# Patient Record
Sex: Female | Born: 1975
Health system: Southern US, Community
[De-identification: ages and names within clinical notes are randomized; demographics above are authoritative.]

---

## 2016-08-03 ENCOUNTER — Other Ambulatory Visit: Payer: Self-pay | Admitting: Obstetrics and Gynecology

## 2016-08-03 DIAGNOSIS — Z369 Encounter for antenatal screening, unspecified: Secondary | ICD-10-CM

## 2016-08-03 LAB — OB RESULTS CONSOLE GC/CHLAMYDIA
CHLAMYDIA, DNA PROBE: NEGATIVE
GC PROBE AMP, GENITAL: NEGATIVE

## 2016-08-03 LAB — OB RESULTS CONSOLE ANTIBODY SCREEN: Antibody Screen: NEGATIVE

## 2016-08-03 LAB — OB RESULTS CONSOLE ABO/RH: RH TYPE: POSITIVE

## 2016-08-03 LAB — OB RESULTS CONSOLE VARICELLA ZOSTER ANTIBODY, IGG: Varicella: IMMUNE

## 2016-08-03 LAB — OB RESULTS CONSOLE RUBELLA ANTIBODY, IGM: Rubella: IMMUNE

## 2016-08-03 LAB — OB RESULTS CONSOLE HEPATITIS B SURFACE ANTIGEN: HEP B S AG: NEGATIVE

## 2016-08-03 LAB — OB RESULTS CONSOLE HIV ANTIBODY (ROUTINE TESTING): HIV: NONREACTIVE

## 2016-08-03 LAB — OB RESULTS CONSOLE RPR: RPR: NONREACTIVE

## 2016-08-22 ENCOUNTER — Ambulatory Visit (HOSPITAL_BASED_OUTPATIENT_CLINIC_OR_DEPARTMENT_OTHER)
Admission: RE | Admit: 2016-08-22 | Discharge: 2016-08-22 | Disposition: A | Discharge status: Home / Self Care | Payer: BLUE CROSS/BLUE SHIELD | Source: Ambulatory Visit | Attending: Maternal and Fetal Medicine | Admitting: Maternal and Fetal Medicine

## 2016-08-22 ENCOUNTER — Encounter: Payer: Self-pay | Admitting: *Deleted

## 2016-08-22 ENCOUNTER — Ambulatory Visit
Admission: RE | Admit: 2016-08-22 | Discharge: 2016-08-22 | Disposition: A | Discharge status: Home / Self Care | Payer: BLUE CROSS/BLUE SHIELD | Source: Ambulatory Visit | Attending: Maternal and Fetal Medicine | Admitting: Maternal and Fetal Medicine

## 2016-08-22 DIAGNOSIS — O09512 Supervision of elderly primigravida, second trimester: Secondary | ICD-10-CM

## 2016-08-22 DIAGNOSIS — Z8279 Family history of other congenital malformations, deformations and chromosomal abnormalities: Secondary | ICD-10-CM

## 2016-08-22 DIAGNOSIS — Z3A14 14 weeks gestation of pregnancy: Secondary | ICD-10-CM | POA: Insufficient documentation

## 2016-08-22 DIAGNOSIS — Z3490 Encounter for supervision of normal pregnancy, unspecified, unspecified trimester: Secondary | ICD-10-CM

## 2016-08-22 DIAGNOSIS — O09519 Supervision of elderly primigravida, unspecified trimester: Secondary | ICD-10-CM | POA: Insufficient documentation

## 2016-08-22 DIAGNOSIS — Z369 Encounter for antenatal screening, unspecified: Secondary | ICD-10-CM

## 2016-08-22 NOTE — Progress Notes (Signed)
Referring Provider:   [facility] Length of Consultation: 45 minutes  [name] was referred to [facility] for genetic counseling because of advanced maternal age.  The patient will be 40 years old at the time of delivery.  This note summarizes the information we discussed.    We explained that the chance of a chromosome abnormality increases with maternal age.  Chromosomes and examples of chromosome problems were reviewed.  Humans typically have 46 chromosomes in each cell, with half passed through each sperm and egg.  Any change in the number or structure of chromosomes can increase the risk of problems in the physical and mental development of a pregnancy.   Based upon age of the patient, the chance of any chromosome abnormality was 1 in 72. The chance of Down syndrome, the most common chromosome problem associated with maternal age, was 1 in 21.  The risk of chromosome problems is in addition to the 3% general population risk for birth defects and mental retardation.  The greatest chance, of course, is that the baby would be born in good health.  We discussed the following prenatal screening and testing options for this pregnancy:  First trimester screening, which includes nuchal translucency ultrasound screen and first trimester maternal serum marker screening.  The nuchal translucency has approximately an 80% detection rate for Down syndrome and can be positive for other chromosome abnormalities as well as heart defects.  When combined with a maternal serum marker screening, the detection rate is up to 90% for Down syndrome and up to 97% for trisomy 18.     The chorionic villus sampling procedure is available for first trimester chromosome analysis.  This involves the withdrawal of a small amount of chorionic villi (tissue from the developing placenta).  Risk of pregnancy loss is estimated to be approximately 1 in 200 to 1 in 100 (0.5 to 1%).  There is  approximately a 1% (1 in 100) chance that the CVS chromosome results will be unclear.  Chorionic villi cannot be tested for neural tube defects.     Maternal serum marker screening, a blood test that measures pregnancy proteins, can provide risk assessments for Down syndrome, trisomy 18, and open neural tube defects (spina bifida, anencephaly). Because it does not directly examine the fetus, it cannot positively diagnose or rule out these problems.  Targeted ultrasound uses high frequency sound waves to create an image of the developing fetus.  An ultrasound is often recommended as a routine means of evaluating the pregnancy.  It is also used to screen for fetal anatomy problems (for example, a heart defect) that might be suggestive of a chromosomal or other abnormality.   Amniocentesis involves the removal of a small amount of amniotic fluid from the sac surrounding the fetus with the use of a thin needle inserted through the maternal abdomen and uterus.  Ultrasound guidance is used throughout the procedure.  Fetal cells from amniotic fluid are directly evaluated and > 99.5% of chromosome problems and > 98% of open neural tube defects can be detected. This procedure is generally performed after the 15th week of pregnancy.  The main risks to this procedure include complications leading to miscarriage in less than 1 in 200 cases (0.5%).  We also reviewed the availability of cell free fetal DNA testing from maternal blood to determine whether or not the baby may have either Down syndrome, trisomy 35, or trisomy 43.  This test utilizes a maternal blood sample and DNA  sequencing technology to isolate circulating cell free fetal DNA from maternal plasma.  The fetal DNA can then be analyzed for DNA sequences that are derived from the three most common chromosomes involved in aneuploidy, chromosomes 13, 18, and 21.  If the overall amount of DNA is greater than the expected level for any of these chromosomes,  aneuploidy is suspected.  While we do not consider it a replacement for invasive testing and karyotype analysis, a negative result from this testing would be reassuring, though not a guarantee of a normal chromosome complement for the baby.  An abnormal result is certainly suggestive of an abnormal chromosome complement, though we would still recommend CVS or amniocentesis to confirm any findings from this testing.  Cystic Fibrosis and Spinal Muscular Atrophy (SMA) screening were also discussed with the patient. Both conditions are recessive, which means that both parents must be carriers in order to have a child with the disease.  Cystic fibrosis (CF) is one of the most common genetic conditions in persons of Caucasian ancestry.  This condition occurs in approximately 1 in 2,500 Caucasian persons and results in thickened secretions in the lungs, digestive, and reproductive systems.  For a baby to be at risk for having CF, both of the parents must be carriers for this condition.  Approximately 1 in 7 Caucasian persons is a carrier for CF.  Current carrier testing looks for the most common mutations in the gene for CF and can detect approximately 90% of carriers in the Caucasian population.  This means that the carrier screening can greatly reduce, but cannot eliminate, the chance for an individual to have a child with CF.  If an individual is found to be a carrier for CF, then carrier testing would be available for the partner. As part of [place] newborn screening profile, all babies born in the state of [place] will have a two-tier screening process.  Specimens are first tested to determine the concentration of immunoreactive trypsinogen (IRT).  The top 5% of specimens with the highest IRT values then undergo DNA testing using a panel of over 40 common CF mutations. SMA is a neurodegenerative disorder that leads to atrophy of skeletal muscle and overall weakness.  This condition is also more  prevalent in the Caucasian population, with 1 in 40-1 in 60 persons being a carrier and 1 in 6,000-1 in 10,000 children being affected.  There are multiple forms of the disease, with some causing death in infancy to other forms with survival into adulthood.  The genetics of SMA is complex, but carrier screening can detect up to 95% of carriers in the Caucasian population.  Similar to CF, a negative result can greatly reduce, but cannot eliminate, the chance to have a child with SMA.  We obtained a detailed family history and pregnancy history.  The patient shared that she has a paternal first cousin once removed with Down syndrome. We discussed that Down syndrome, or Trisomy 66, can occur in two main ways. The first is spontaneous nondisjunction, leading to three free chromosome 21's in the baby. If this is the case in a family member, the risk for this couple to have a baby with Down syndrome is not elevated above her age-related risk (1/96 or about 1%). Spontaneous nondisjunction causes about 95% of Down syndrome cases. A less common way for Down syndrome to occur (less than 5% of cases) is through an unbalanced translocation arising from a parent with a balanced translocation between two chromosomes, one  of which is chromosome 21. If the chromosome with attached chromosome 21 material is passed on, the baby can have three chromosome 21's, two free 21's and one attached to another chromosome. We discussed that signs of a family member having a balanced translocation can include a history of multiple miscarriages or birth defects, which were not indicated in [name]'s family history.   The patient also reported three paternal aunts with breast cancer in their 73's and 100's. She mentioned that she has had no genetic testing, but it has been offered to her. We discussed that if possible, individuals in the family with a diagnosis of breast cancer should be tested first. If she becomes more interested in  genetic testing for breast cancer, we can put her in contact with a cancer genetic counselor.  [name] reported no other family history of individuals being born deaf or blind, having muscle weakness, a blood disorder, intellectual disability, birth defects, or more than three miscarriages. Consanguinity and Ashkenazi Jewish ancestry were denied.   [name] stated this is her first pregnancy.  She reported no complications or exposures that would be expected to increase the risk for birth defects.  After consideration of the options, [name] elected to proceed with Informaseq with XY as well as CF and SMA carrier testing.  An ultrasound was performed at the time of the visit.  The gestational age was consistent with  24 weeks.  Fetal anatomy could not be assessed due to early gestational age.  Please refer to the ultrasound report for details of that study.  [name] was encouraged to call with questions or concerns.  We can be contacted at [phone].   Wilburt Finlay, MS, CGC

## 2016-08-23 LAB — MISC LABCORP TEST (SEND OUT): LABCORP TEST CODE: 450010

## 2016-08-26 LAB — INFORMASEQ(SM) WITH XY ANALYSIS
FETAL NUMBER: 1
Fetal Fraction (%):: 15.3
Gestational Age at Collection: 15 weeks
Weight: 161 [lb_av]

## 2016-08-26 LAB — CYSTIC FIBROSIS GENE TEST

## 2016-08-29 ENCOUNTER — Telehealth: Payer: Self-pay | Admitting: Obstetrics and Gynecology

## 2016-08-29 NOTE — Telephone Encounter (Signed)
I spoke with Ms. Macario regarding the results of her InformaSeq testing as well as CF carrier screening drawn on 08/22/2016.  The SMA screening from that date is still pending.  The patient was informed of the results of her recent InformaSeq testing (performed at [facility]) which yielded NEGATIVE results.  The patient's specimen showed DNA consistent with two copies of chromosomes 21, 18 and 13.  The sensitivity for trisomy 24, trisomy 22 and trisomy 88 using this testing are reported as 99.1%, 98.3% and 98.1% respectively.  Thus, while the results of this testing are highly accurate, they are not considered diagnostic at this time.  Should more definitive information be desired, the patient may still consider amniocentesis.   As requested to know by the patient, sex chromosome analysis was included for this sample.  Results was consistent with a female fetus. This is predicted with >97% accuracy.  A maternal serum AFP only should be considered if screening for neural tube defects is desired.   To review, CF is a genetic condition that occurs most often in Caucasian persons.  It primarily affects the lungs, digestive, and reproductive systems.  For someone to be at risk for having CF, both of their parents must be carriers for CF.  The testing can detect many persons who are carriers for CF and therefore determine if the pregnancy is at an increased risk for this condition.    The blood test results were negative when examined for the 32 most common mutations (or changes) in the gene for CF.  This means that she does not carry any of the most common changes in this gene.  Testing for these 32 mutations detects approximately 90% of carriers who are Caucasian.  Therefore, the chance that she is a carrier based on this negative result has been reduced from 1 in 25 to approximately 1 in 240.  Because this testing cannot detect all changes that may cause CF, we cannot eliminate the chance that this individual  is a carrier completely.  If there are any questions or concerns, please feel free to contact our office at [phone].      Wilburt Finlay, MS, CGC

## 2016-09-05 ENCOUNTER — Telehealth: Payer: Self-pay | Admitting: Obstetrics and Gynecology

## 2016-09-05 NOTE — Telephone Encounter (Signed)
The results of the SMA carrier screening are now available.  SMA is a recessive genetic condition with variable age of onset and severity caused by mutations in the SMN1 gene.  This carrier testing assesses the number of copies of this gene.  Persons with one copy of the SMN1 gene are carriers, and those with no copies are affected with the condition.  Individuals with two or more copies have a reduced chance to be a carrier.  Not all mutations can be detected with this testing, though it can detect 94.8% of carriers in the Caucasian population.  The results revealed that Carolyn Lopez has an SMN1 copy number of 2, thus reducing her chance to be a carrier from 1 in 39 to 1 in 834.  Again, this testing cannot eliminate the chance to have a child with SMA, but dramatically reduces the chance.    We encouraged the patient to call with any questions or concerns as they arise.  We may be reached at [phone].  Wilburt Finlay, MS, CGC

## 2017-01-20 LAB — OB RESULTS CONSOLE GC/CHLAMYDIA
Chlamydia: NEGATIVE
Chlamydia: NEGATIVE
GC PROBE AMP, GENITAL: NEGATIVE
GC PROBE AMP, GENITAL: NEGATIVE

## 2017-01-20 LAB — OB RESULTS CONSOLE GBS
STREP GROUP B AG: NEGATIVE
STREP GROUP B AG: POSITIVE

## 2017-02-10 ENCOUNTER — Inpatient Hospital Stay: Admission: RE | Admit: 2017-02-10 | Payer: BLUE CROSS/BLUE SHIELD | Source: Ambulatory Visit

## 2017-02-13 ENCOUNTER — Inpatient Hospital Stay
Admission: RE | Admit: 2017-02-13 | Payer: BLUE CROSS/BLUE SHIELD | Source: Ambulatory Visit | Admitting: Obstetrics & Gynecology

## 2017-02-13 ENCOUNTER — Encounter: Admission: RE | Payer: Self-pay | Source: Ambulatory Visit

## 2017-02-13 ENCOUNTER — Inpatient Hospital Stay
Admission: EM | Admit: 2017-02-13 | Discharge: 2017-02-14 | DRG: 782 | Disposition: A | Discharge status: Home / Self Care | Payer: BLUE CROSS/BLUE SHIELD | Attending: Obstetrics and Gynecology | Admitting: Obstetrics and Gynecology

## 2017-02-13 DIAGNOSIS — O403XX0 Polyhydramnios, third trimester, not applicable or unspecified: Principal | ICD-10-CM | POA: Diagnosis present

## 2017-02-13 DIAGNOSIS — Z79899 Other long term (current) drug therapy: Secondary | ICD-10-CM | POA: Diagnosis not present

## 2017-02-13 DIAGNOSIS — Z3A39 39 weeks gestation of pregnancy: Secondary | ICD-10-CM

## 2017-02-13 DIAGNOSIS — O320XX0 Maternal care for unstable lie, not applicable or unspecified: Secondary | ICD-10-CM | POA: Diagnosis present

## 2017-02-13 DIAGNOSIS — Z22330 Carrier of Group B streptococcus: Secondary | ICD-10-CM | POA: Diagnosis not present

## 2017-02-13 DIAGNOSIS — O409XX0 Polyhydramnios, unspecified trimester, not applicable or unspecified: Secondary | ICD-10-CM | POA: Diagnosis present

## 2017-02-13 LAB — CBC
HEMATOCRIT: 36.5 % (ref 35.0–47.0)
HEMOGLOBIN: 12.9 g/dL (ref 12.0–16.0)
MCH: 32.1 pg (ref 26.0–34.0)
MCHC: 35.5 g/dL (ref 32.0–36.0)
MCV: 90.6 fL (ref 80.0–100.0)
Platelets: 201 10*3/uL (ref 150–440)
RBC: 4.02 MIL/uL (ref 3.80–5.20)
RDW: 13.9 % (ref 11.5–14.5)
WBC: 5.9 10*3/uL (ref 3.6–11.0)

## 2017-02-13 SURGERY — Surgical Case
Anesthesia: Spinal

## 2017-02-13 MED ORDER — OXYTOCIN 40 UNITS IN LACTATED RINGERS INFUSION - SIMPLE MED: 2.5000 [IU]/h | INTRAVENOUS | Status: DC

## 2017-02-13 MED ORDER — PENICILLIN G POT IN DEXTROSE 60000 UNIT/ML IV SOLN
3.0000 10*6.[IU] | INTRAVENOUS | Status: DC
  Administered 2017-02-13 – 2017-02-14 (×5): 3 10*6.[IU] via INTRAVENOUS
  Filled 2017-02-13 (×18): qty 50

## 2017-02-13 MED ORDER — ACETAMINOPHEN 500 MG PO TABS: 1000.0000 mg | ORAL_TABLET | ORAL | Status: DC | PRN

## 2017-02-13 MED ORDER — LIDOCAINE HCL (PF) 1 % IJ SOLN: 30.0000 mL | INTRAMUSCULAR | Status: DC | PRN

## 2017-02-13 MED ORDER — MISOPROSTOL 200 MCG PO TABS
ORAL_TABLET | ORAL | Status: AC
  Filled 2017-02-13: qty 4

## 2017-02-13 MED ORDER — TERBUTALINE SULFATE 1 MG/ML IJ SOLN: 0.2500 mg | Freq: Once | INTRAMUSCULAR | Status: DC | PRN

## 2017-02-13 MED ORDER — LIDOCAINE HCL (PF) 1 % IJ SOLN
INTRAMUSCULAR | Status: AC
  Filled 2017-02-13: qty 30

## 2017-02-13 MED ORDER — OXYTOCIN 40 UNITS IN LACTATED RINGERS INFUSION - SIMPLE MED: 1.0000 m[IU]/min | INTRAVENOUS | Status: DC

## 2017-02-13 MED ORDER — ONDANSETRON HCL 4 MG/2ML IJ SOLN: 4.0000 mg | Freq: Four times a day (QID) | INTRAMUSCULAR | Status: DC | PRN

## 2017-02-13 MED ORDER — AMMONIA AROMATIC IN INHA
RESPIRATORY_TRACT | Status: AC
  Filled 2017-02-13: qty 10

## 2017-02-13 MED ORDER — MISOPROSTOL 25 MCG QUARTER TABLET
25.0000 ug | ORAL_TABLET | ORAL | Status: DC | PRN
  Administered 2017-02-13 – 2017-02-14 (×3): 25 ug via ORAL
  Filled 2017-02-13: qty 0.25
  Filled 2017-02-13: qty 1
  Filled 2017-02-13 (×2): qty 0.25

## 2017-02-13 MED ORDER — OXYTOCIN BOLUS FROM INFUSION: 500.0000 mL | Freq: Once | INTRAVENOUS | Status: DC

## 2017-02-13 MED ORDER — ZOLPIDEM TARTRATE 5 MG PO TABS
5.0000 mg | ORAL_TABLET | Freq: Every evening | ORAL | Status: DC | PRN
  Administered 2017-02-13: 5 mg via ORAL

## 2017-02-13 MED ORDER — PENICILLIN G POTASSIUM 5000000 UNITS IJ SOLR
5.0000 10*6.[IU] | Freq: Once | INTRAMUSCULAR | Status: AC
  Administered 2017-02-13: 5 10*6.[IU] via INTRAVENOUS
  Filled 2017-02-13: qty 5

## 2017-02-13 MED ORDER — SOD CITRATE-CITRIC ACID 500-334 MG/5ML PO SOLN: 30.0000 mL | ORAL | Status: DC | PRN

## 2017-02-13 MED ORDER — LACTATED RINGERS IV SOLN
INTRAVENOUS | Status: DC
  Administered 2017-02-13 (×2): via INTRAVENOUS

## 2017-02-13 MED ORDER — DINOPROSTONE 10 MG VA INST
10.0000 mg | VAGINAL_INSERT | Freq: Once | VAGINAL | Status: AC
  Administered 2017-02-13: 10 mg via VAGINAL
  Filled 2017-02-13: qty 1

## 2017-02-13 MED ORDER — ZOLPIDEM TARTRATE 5 MG PO TABS
ORAL_TABLET | ORAL | Status: AC
  Administered 2017-02-13: 5 mg via ORAL
  Filled 2017-02-13: qty 1

## 2017-02-13 MED ORDER — LACTATED RINGERS IV SOLN: 500.0000 mL | INTRAVENOUS | Status: DC | PRN

## 2017-02-13 MED ORDER — OXYTOCIN 10 UNIT/ML IJ SOLN
INTRAMUSCULAR | Status: AC
  Filled 2017-02-13: qty 2

## 2017-02-13 NOTE — H&P (Signed)
OB History & Physical   History of Present Illness:  Chief Complaint:   HPI:  Carolyn Lopez is a 41 y.o. G1P0 female at 82.1 dated by 1st trimester ultrasound with EDC of 02/19/17 (LMP was 05/09/16, but discordant with EDC).  She presents to L&D for labor induction/possible version/possible cesarean.  +FM, no CTX, no LOF, no VB  Pregnancy Issues: 1. Polyhydramnios 2. Unstable fetal lie - was breech, transverse, and cephalic several times in last 4 weeks  3. AMA 4. Family history of Trisomy 21, neg NIPT 5. Scoliosis 6. Had Influenza A this pregnancy 7. GBS positive 8. 5cm posterior fibroid  Maternal Medical History:  PMH: scoliosis, anemia, hx abnormal pap  PSH: wisdom teeth  No Known Allergies  Prior to Admission medications   Medication Sig Start Date End Date Taking? Authorizing Provider  ferrous sulfate 325 (65 FE) MG tablet Take 325 mg by mouth daily with breakfast.    Historical Provider, MD  MAGNESIUM CITRATE PO Take 2 tablets by mouth daily.    Historical Provider, MD  Prenatal Vit-Fe Fumarate-FA (PRENATAL MULTIVITAMIN) TABS tablet Take 1 tablet by mouth daily at 12 noon.    Historical Provider, MD     Prenatal care site: [place] History: She  reports that she has never smoked. She has quit using smokeless tobacco. She reports that she does not drink alcohol or use drugs.  Family History: family history includes Cancer in her maternal grandmother, paternal aunt, paternal aunt, and paternal aunt; Hearing loss in her father; Heart disease in her father; Hyperlipidemia in her father; Hypertension in her father.   Review of Systems: A full review of systems was performed and negative except as noted in the HPI.     Physical Exam:  Vital Signs: BP 112/69   Pulse 83   Temp 98.7 F (37.1 C) (Oral)   Resp 15   Ht 5\' 3"  (1.6 m)   Wt 83 kg (183 lb)   LMP 05/09/2016 (Approximate)   BMI 32.42 kg/m  General: no acute distress.  HEENT:  normocephalic, atraumatic Heart: regular rate & rhythm.  No murmurs/rubs/gallops Lungs: clear to auscultation bilaterally, normal respiratory effort Abdomen: soft, gravid, non-tender;  EFW: 7.7 Pelvic:   External: Normal external female genitalia  Cervix: Dilation: Closed / Effacement (%): Thick /   high, posterior   Extremities: non-tender, symmetric, 1+ edema bilaterally.  DTRs: 2+  Neurologic: Alert & oriented x 3.    Results for orders placed or performed during the hospital encounter of 02/13/17 (from the past 24 hour(s))  CBC     Status: None   Collection Time: 02/13/17  8:29 AM  Result Value Ref Range   WBC 5.9 3.6 - 11.0 K/uL   RBC 4.02 3.80 - 5.20 MIL/uL   Hemoglobin 12.9 12.0 - 16.0 g/dL   HCT 36.5 35.0 - 47.0 %   MCV 90.6 80.0 - 100.0 fL   MCH 32.1 26.0 - 34.0 pg   MCHC 35.5 32.0 - 36.0 g/dL   RDW 13.9 11.5 - 14.5 %   Platelets 201 150 - 440 K/uL  Type and screen [place]     Status: None   Collection Time: 02/13/17  8:29 AM  Result Value Ref Range   ABO/RH(D) A POS    Antibody Screen NEG    Sample Expiration 02/16/2017     Pertinent Results:  Prenatal Labs: Blood type/Rh A pos  Antibody screen neg  Rubella Immune  Varicella Immune  RPR NR  HBsAg Neg  HIV NR  GC neg  Chlamydia neg  Genetic screening negative  1 hour GTT 106  3 hour GTT --  GBS Positive    FHT: 135 mod + accels no decels TOCO:  none SVE:  Dilation: Closed / Effacement (%): Thick /      Cephalic by leopolds  No results found.  Assessment:  Carolyn Lopez is a 41 y.o. G1P0 female at 80.1 with IOL for unstable fetal lie, cephalic currently.   Plan:  1. Admit to Labor & Delivery 2. CBC, T&S, Clrs, IVF 3. GBS  Positive - PCN 4. Consents obtained. 5. Continuous efm/toco 6. Category 1 tracing 7. IOL: bishop 1 - cytotec buccally q4h. Add foley bulb if possible  ----- Larey Days, MD Attending Obstetrician and Gynecologist [facility], Department of  [facility]

## 2017-02-14 LAB — RPR: RPR: NONREACTIVE

## 2017-02-14 MED ORDER — MISOPROSTOL 25 MCG QUARTER TABLET
25.0000 ug | ORAL_TABLET | ORAL | Status: DC
  Administered 2017-02-14: 25 ug via VAGINAL

## 2017-02-14 NOTE — Discharge Instructions (Signed)
Discharge instructions given, patient stated understanding. Fetal kick counts given.

## 2017-02-14 NOTE — Discharge Summary (Signed)
Obstetric Discharge Summary  41yo G1P0 at 39+2wks with hx of unstable fetal lie, and polyhydramnios. Pt was admitted yesterday after confirming cephalic presentation for iol. However, pt is now over 36hrs of cervical ripening with prostaglandins, beautiful contractions with cytotec, and is still closed.  SVE: closed/50/high  Discussion with patient and husband regarding risks and benefits of continuing trial of induction vs d/c home with f/u in one week. She is not in labor and I can't fit a Foley bulb or AROM at this time. We discussed continue ripening, proceed with elective pLTCS, or wait for labor as outpatient with the plan to retry if improved Bishop score next week or proceed medical induction after 41 wks.   If with unstable lie the baby reverts to breech, then she would still be a candidate for cephalic version.   Pt to be seen in office in 2 days. Labor precautions given.  Plan for NST after 40 wks.  Hemoglobin  Date Value Ref Range Status  02/13/2017 12.9 12.0 - 16.0 g/dL Final   HCT  Date Value Ref Range Status  02/13/2017 36.5 35.0 - 47.0 % Final    Physical Exam:  General: alert, cooperative and appears stated age Uterine Fundus: non tender Cervix: closed/50/high  Discharge Diagnoses: Term Pregnancy-not delivered  Discharge Information: Date: 02/14/2017 Activity: unrestricted Diet: routine Medications: None Condition: stable Discharge to: home   Benjaman Kindler 02/14/2017, 6:39 PM

## 2017-02-14 NOTE — Progress Notes (Signed)
Patient ID: Carolyn Lopez, female   DOB: 08/27/1976, 41 y.o.   MRN: [id number]  40yo G1P0 at 39+2wks with hx of unstable fetal lie, and polyhydramnios. Pt was admitted yesterday after confirming cephalic presentation for iol. However, pt is now over 24hrs of cervical ripening with prostaglandins and is still closed.  SVE: closed/50/high  Discussion with patient and husband regarding risks and benefits of continuing trial of induction vs d/c home with f/u in one week. She is not in labor and I can't fit a Foley bulb or AROM at this time. We can continue ripening, proceed with elective pLTCS, or wait for labor as outpatient with the plan to retry if improved Bishop score next week or proceed medical induction after 41 wks.   If with unstable lie the baby reverts to breech, then she would still be a candidate for cephalic version.   She decided to trial another dose of cytotec, which I placed vaginally, and recheck in 4 hours.   She is contracting but not feeling them. Cat I strip.

## 2017-02-15 LAB — TYPE AND SCREEN
ABO/RH(D): A POS
Antibody Screen: NEGATIVE
DAT, IGG: NEGATIVE
WEAK D: POSITIVE

## 2017-02-19 ENCOUNTER — Inpatient Hospital Stay
Admission: EM | Admit: 2017-02-19 | Discharge: 2017-02-23 | DRG: 765 | Disposition: A | Discharge status: Home / Self Care | Payer: BLUE CROSS/BLUE SHIELD | Attending: Obstetrics and Gynecology | Admitting: Obstetrics and Gynecology

## 2017-02-19 ENCOUNTER — Inpatient Hospital Stay: Admit: 2017-02-19 | Payer: Self-pay

## 2017-02-19 DIAGNOSIS — M419 Scoliosis, unspecified: Secondary | ICD-10-CM | POA: Diagnosis present

## 2017-02-19 DIAGNOSIS — O339 Maternal care for disproportion, unspecified: Secondary | ICD-10-CM | POA: Diagnosis present

## 2017-02-19 DIAGNOSIS — O403XX0 Polyhydramnios, third trimester, not applicable or unspecified: Principal | ICD-10-CM | POA: Diagnosis present

## 2017-02-19 DIAGNOSIS — O99824 Streptococcus B carrier state complicating childbirth: Secondary | ICD-10-CM | POA: Diagnosis present

## 2017-02-19 DIAGNOSIS — O3413 Maternal care for benign tumor of corpus uteri, third trimester: Secondary | ICD-10-CM | POA: Diagnosis present

## 2017-02-19 DIAGNOSIS — O9081 Anemia of the puerperium: Secondary | ICD-10-CM | POA: Diagnosis not present

## 2017-02-19 DIAGNOSIS — O480 Post-term pregnancy: Secondary | ICD-10-CM | POA: Diagnosis present

## 2017-02-19 DIAGNOSIS — Z3A40 40 weeks gestation of pregnancy: Secondary | ICD-10-CM

## 2017-02-19 DIAGNOSIS — D62 Acute posthemorrhagic anemia: Secondary | ICD-10-CM | POA: Diagnosis not present

## 2017-02-19 DIAGNOSIS — D251 Intramural leiomyoma of uterus: Secondary | ICD-10-CM | POA: Diagnosis present

## 2017-02-19 DIAGNOSIS — D252 Subserosal leiomyoma of uterus: Secondary | ICD-10-CM | POA: Diagnosis present

## 2017-02-19 DIAGNOSIS — Z3490 Encounter for supervision of normal pregnancy, unspecified, unspecified trimester: Secondary | ICD-10-CM

## 2017-02-19 LAB — CBC
HEMATOCRIT: 35.7 % (ref 35.0–47.0)
Hemoglobin: 12.6 g/dL (ref 12.0–16.0)
MCH: 31.9 pg (ref 26.0–34.0)
MCHC: 35.2 g/dL (ref 32.0–36.0)
MCV: 90.6 fL (ref 80.0–100.0)
Platelets: 186 10*3/uL (ref 150–440)
RBC: 3.94 MIL/uL (ref 3.80–5.20)
RDW: 13.5 % (ref 11.5–14.5)
WBC: 6 10*3/uL (ref 3.6–11.0)

## 2017-02-19 LAB — RAPID HIV SCREEN (HIV 1/2 AB+AG)
HIV 1/2 Antibodies: NONREACTIVE
HIV-1 P24 Antigen - HIV24: NONREACTIVE

## 2017-02-19 MED ORDER — SODIUM CHLORIDE 0.9 % IV SOLN
1.0000 g | INTRAVENOUS | Status: DC
  Administered 2017-02-20 – 2017-02-21 (×10): 1 g via INTRAVENOUS
  Filled 2017-02-19 (×14): qty 1000

## 2017-02-19 MED ORDER — MISOPROSTOL 25 MCG QUARTER TABLET
25.0000 ug | ORAL_TABLET | ORAL | Status: DC
  Administered 2017-02-19: 25 ug via VAGINAL
  Administered 2017-02-20: 50 ug via VAGINAL
  Administered 2017-02-20 (×3): 25 ug via VAGINAL
  Filled 2017-02-19 (×3): qty 1
  Filled 2017-02-19: qty 4
  Filled 2017-02-19: qty 1

## 2017-02-19 MED ORDER — MISOPROSTOL 200 MCG PO TABS
ORAL_TABLET | ORAL | Status: AC
  Administered 2017-02-19: 25 ug via VAGINAL
  Filled 2017-02-19: qty 4

## 2017-02-19 MED ORDER — BUTORPHANOL TARTRATE 1 MG/ML IJ SOLN
1.0000 mg | INTRAMUSCULAR | Status: DC | PRN
  Administered 2017-02-21 (×2): 2 mg via INTRAVENOUS
  Filled 2017-02-19 (×2): qty 2

## 2017-02-19 MED ORDER — OXYTOCIN 10 UNIT/ML IJ SOLN
INTRAMUSCULAR | Status: AC
  Filled 2017-02-19: qty 2

## 2017-02-19 MED ORDER — SODIUM CHLORIDE 0.9 % IV SOLN
2.0000 g | Freq: Once | INTRAVENOUS | Status: AC
  Administered 2017-02-19: 2 g via INTRAVENOUS
  Filled 2017-02-19: qty 2000

## 2017-02-19 MED ORDER — ZOLPIDEM TARTRATE 5 MG PO TABS
5.0000 mg | ORAL_TABLET | Freq: Every evening | ORAL | Status: DC | PRN
  Administered 2017-02-19: 5 mg via ORAL
  Filled 2017-02-19: qty 1

## 2017-02-19 MED ORDER — AMMONIA AROMATIC IN INHA
RESPIRATORY_TRACT | Status: AC
  Filled 2017-02-19: qty 10

## 2017-02-19 MED ORDER — LIDOCAINE HCL (PF) 1 % IJ SOLN
INTRAMUSCULAR | Status: AC
  Filled 2017-02-19: qty 30

## 2017-02-19 NOTE — H&P (Signed)
Carolyn Lopez is a 41 y.o. female presenting for IOL at term for Polyhydramnios. Marland KitchenHPI: She is a 41 y.o., female, G1P0, at [redacted]w[redacted]d based on LMP of 05/09/16, but dated by an early [place] on 06/30/16 at [facility], making her 6+4 weeks with an Estimated Date of Delivery: 02/19/17. She was dated by that ultrasound because she was having irregular menstrual cycles. She is here to initiate prenatal care. She recently married in July. FOB is involved and in good health - age 36 yo. He has 2 normal, healthy children ages 26 and 84.   GENETIC HX:, Patient's father has a niece with Down Syndrome. No other relative. No Trisomies or spinal bifida on either side of family. No twin or triplet history.  OB History  Gravida Para Term Preterm AB Living  1  SAB TAB Ectopic Multiple Live Births   # Outcome Date GA Lbr Len/2nd Weight Sex Delivery Anes PTL Lv  1 Current    The following portions of the patient's history were reviewed and updated as appropriate: Past Medical History: has a past medical history of Abnormal Pap smear of cervix (2000) and Scoliosis. Past Surgical History: has a past surgical history that includes wisdom teeth. Family History: family history includes Breast cancer (age of onset: 24) in her paternal aunt; Breast cancer (age of onset: 57) in her paternal aunt and paternal aunt; Breast cancer (age of onset: 5) in her maternal grandmother; Diabetes mellitus in her father; Heart attack in her father; Heart disease in her father; Hyperlipidemia in her father; Hypertension in her father; Liver cancer in her maternal grandmother. Social History: reports that she has never smoked. She has never used smokeless tobacco. She reports that she drinks alcohol. She reports that she does not use illicit drugs. Current Meds: has a current medication list which includes the following prescription(s): doxylamine-pyridoxine and prenatal vitamin-iron-fa-dha-docusate.  Allergies: has No Known Allergies.  Patient Active  Problem List  Diagnosis  1. Term preg for IOL with polyhydramnios  ROS: General: No weight loss or weight gain, + fatigue, no fevers. HEENT: No change in vision, double vision or loss of hearing. No nosebleeds, difficulty swallowing, sore throat or any other complaints.  BREAST:no  breast tenderness, no nipple discharge, no masses, no skin changes, no nodes HEART: No CP, palpatations, swelling in the feet or legs or difficulty breathing while lying flat. LUNGS: No SOB, Cough, Congestion or wheezing. GASTROINTESTINAL: No N&V, no diarrhea, constipation, heartburn or stomach pain GENITOURINARY: No urgency, +frequency, no dysuria. MUSCULOSKELETAL: No joint pain, weakness, cramping or back pain. NEUROLOGIC: No frequent HA's, numbness, blackouts or dizziness. PSYCHIATRIC: No anxiety, panic or depression or increased stress or suicidal ideation. +emotional  ENDOCRINE: No heat or cold intolerance, excessive thirst or hunger. HEMATOLOGIC/LYMPH: No easy bruising or swollen lymph nodes. GYN: No vaginal bleeding, cramping, abnormal vaginal discharge,      OB History    Gravida Para Term Preterm AB Living   1             SAB TAB Ectopic Multiple Live Births                 Family History: family history includes Cancer in her maternal grandmother, paternal aunt, paternal aunt, and paternal aunt; Hearing loss in her father; Heart disease in her father; Hyperlipidemia in her father; Hypertension in her father. Social History:  reports that she has never smoked. She has quit using smokeless tobacco. She reports that she does not drink alcohol or use drugs.  Maternal Diabetes:106 Genetic Screening:  Maternal Ultrasounds/Referrals: AFI 14 cm on 02/08/17, Normal Anatomy Fetal Ultrasounds or other Referrals:none Maternal Substance Abuse:  none Significant Maternal Medications:  PNV Significant Maternal Lab Results: GBS pos  ROS  Review of Systems -   History Dilation: Closed Effacement  (%): 0 Exam by:: C Niccole Witthuhn Blood pressure 108/69, pulse 86, temperature 98.4 F (36.9 C), temperature source Oral, resp. rate 18, height 5\' 3"  (1.6 m), weight 88.5 kg (195 lb), last menstrual period 05/09/2016, SpO2 100 %. Exam Physical Exam  Gen: 41 yo white female in NAD. HEENT: normocephalic, Eyes non-icteric.  Heart: s1S2, RRR, No M/R/G. Lungs: CTA bilat, no W/R/R. Abd: Gravid, Vtx by [employer], EFW 7#10oz Cx: closed/long/post Prenatal labs: ABO, Rh: --/--/A POS (03/12 4782) Antibody: NEG (03/12 0829) Rubella: Immune (08/30 0000) RPR: Non Reactive (03/12 0829)  HBsAg: Negative (08/30 0000)  HIV: Non-reactive (08/30 0000)  GBS: Positive, Negative (02/16 0000)   Assessment/Plan: A; IUP at 40 weeks for IOL due to Polyhydramnios 2. GBS pos P: 1. Disc with Dr Leafy Ro and she advises to use Cytotec 25 mcg per vagina per protocol. 2. Continue to monitor UC/FHT and VS. 3. Antibiotics for GBS +.    Catheryn Bacon 02/19/2017, 8:48 PM

## 2017-02-20 DIAGNOSIS — Z3490 Encounter for supervision of normal pregnancy, unspecified, unspecified trimester: Secondary | ICD-10-CM

## 2017-02-20 LAB — CBC
HEMATOCRIT: 36.7 % (ref 35.0–47.0)
Hemoglobin: 13 g/dL (ref 12.0–16.0)
MCH: 32.1 pg (ref 26.0–34.0)
MCHC: 35.4 g/dL (ref 32.0–36.0)
MCV: 90.6 fL (ref 80.0–100.0)
Platelets: 179 10*3/uL (ref 150–440)
RBC: 4.05 MIL/uL (ref 3.80–5.20)
RDW: 13.7 % (ref 11.5–14.5)
WBC: 9.3 10*3/uL (ref 3.6–11.0)

## 2017-02-20 MED ORDER — ACETAMINOPHEN 325 MG PO TABS: 650.0000 mg | ORAL_TABLET | ORAL | Status: DC | PRN

## 2017-02-20 MED ORDER — SOD CITRATE-CITRIC ACID 500-334 MG/5ML PO SOLN
30.0000 mL | ORAL | Status: DC | PRN
  Administered 2017-02-21: 30 mL via ORAL
  Filled 2017-02-20: qty 15

## 2017-02-20 MED ORDER — ONDANSETRON HCL 4 MG/2ML IJ SOLN: 4.0000 mg | Freq: Four times a day (QID) | INTRAMUSCULAR | Status: DC | PRN

## 2017-02-20 MED ORDER — LIDOCAINE HCL (PF) 1 % IJ SOLN: 30.0000 mL | INTRAMUSCULAR | Status: DC | PRN

## 2017-02-20 MED ORDER — LACTATED RINGERS IV SOLN: 500.0000 mL | INTRAVENOUS | Status: DC | PRN

## 2017-02-20 MED ORDER — TERBUTALINE SULFATE 1 MG/ML IJ SOLN
0.2500 mg | Freq: Once | INTRAMUSCULAR | Status: DC | PRN
Start: 2017-02-20 — End: 2017-02-21

## 2017-02-20 MED ORDER — ONDANSETRON HCL 4 MG/2ML IJ SOLN
4.0000 mg | Freq: Four times a day (QID) | INTRAMUSCULAR | Status: DC | PRN
  Administered 2017-02-21 (×2): 4 mg via INTRAVENOUS
  Filled 2017-02-20 (×2): qty 2

## 2017-02-20 MED ORDER — LACTATED RINGERS IV SOLN
INTRAVENOUS | Status: DC
  Administered 2017-02-20: 23:00:00 via INTRAVENOUS

## 2017-02-20 MED ORDER — LACTATED RINGERS IV SOLN
INTRAVENOUS | Status: DC
  Administered 2017-02-20 – 2017-02-21 (×3): via INTRAVENOUS

## 2017-02-20 MED ORDER — BUTORPHANOL TARTRATE 1 MG/ML IJ SOLN: 1.0000 mg | INTRAMUSCULAR | Status: DC | PRN

## 2017-02-20 MED ORDER — OXYTOCIN 40 UNITS IN LACTATED RINGERS INFUSION - SIMPLE MED
2.5000 [IU]/h | INTRAVENOUS | Status: DC
  Filled 2017-02-20 (×2): qty 1000

## 2017-02-20 MED ORDER — SOD CITRATE-CITRIC ACID 500-334 MG/5ML PO SOLN: 30.0000 mL | ORAL | Status: DC | PRN

## 2017-02-20 MED ORDER — LACTATED RINGERS IV SOLN
INTRAVENOUS | Status: DC
  Administered 2017-02-20: 16:00:00 via INTRAVENOUS

## 2017-02-20 MED ORDER — OXYTOCIN BOLUS FROM INFUSION: 500.0000 mL | Freq: Once | INTRAVENOUS | Status: DC

## 2017-02-20 MED ORDER — SODIUM CHLORIDE FLUSH 0.9 % IV SOLN
INTRAVENOUS | Status: AC
  Filled 2017-02-20: qty 30

## 2017-02-20 MED ORDER — OXYTOCIN 40 UNITS IN LACTATED RINGERS INFUSION - SIMPLE MED: 2.5000 [IU]/h | INTRAVENOUS | Status: DC

## 2017-02-20 MED ORDER — LACTATED RINGERS IV SOLN
500.0000 mL | INTRAVENOUS | Status: DC | PRN
  Administered 2017-02-21 (×2): 500 mL via INTRAVENOUS
  Administered 2017-02-21: 1000 mL via INTRAVENOUS

## 2017-02-20 MED ORDER — SODIUM CHLORIDE FLUSH 0.9 % IV SOLN
INTRAVENOUS | Status: AC
  Filled 2017-02-20: qty 20

## 2017-02-20 NOTE — Plan of Care (Signed)
Dr ward checked pt . States pt is 3cm internally and ft externally. Foley balloon inserted by dr ward with 30cc sterile nacl inserted into foley catheter balloon that was inserted.

## 2017-02-20 NOTE — Plan of Care (Signed)
Dr ward here to check pt.

## 2017-02-20 NOTE — Progress Notes (Signed)
Carolyn Lopez is a 41 y.o. G1P0 at [redacted]w[redacted]d  Pt started cytotec induction last pm . Some cramping with regular ctx Subjective:   Objective: BP 108/72 (BP Location: Left Arm)   Pulse 78   Temp 98.3 F (36.8 C) (Oral)   Resp 16   Ht 5\' 3"  (1.6 m)   Wt 195 lb (88.5 kg)   LMP 05/09/2016 (Approximate)   SpO2 100%   BMI 34.54 kg/m  No intake/output data recorded. No intake/output data recorded.  FHT:  FHR: 150 bpm, variability: FHR: 150 bpm, variability: moderate,  accelerations:  Present,  decelerations:  Absent,  accelerations:  Present,  decelerations:  Absent UC:   regular, every 2-3 minutes SVE:   Dilation: Closed Effacement (%): 0 Station: -3 Exam by:: Ranell Patrick, CNM cx not checked this am  Labs: Lab Results  Component Value Date   WBC 6.0 02/19/2017   HGB 12.6 02/19/2017   HCT 35.7 02/19/2017   MCV 90.6 02/19/2017   PLT 186 02/19/2017    Assessment / Plan: Polyhydramnios  Day 2 of induction . Regular ctx . Re check in 90 for next cytotec vs cervidil    SCHERMERHORN,THOMAS 02/20/2017, 7:34 AM

## 2017-02-20 NOTE — Progress Notes (Signed)
Patient ID: Carolyn Lopez, female   DOB: 1976-01-30, 41 y.o.   MRN: [id number] Mild cramping still Reactive fetal monitoring  Ctx q 2-4 minutes CX still closed /30%/ blt 50 mcg cytotec placed  Cont observation

## 2017-02-20 NOTE — Plan of Care (Signed)
Dr schermerhorn in to check pt. No cervical change. cytotec 50 mcg inserted into vagina

## 2017-02-21 ENCOUNTER — Inpatient Hospital Stay: Payer: BLUE CROSS/BLUE SHIELD | Admitting: Anesthesiology

## 2017-02-21 ENCOUNTER — Encounter
Admission: EM | Disposition: A | Discharge status: Home / Self Care | Payer: Self-pay | Source: Home / Self Care | Attending: Obstetrics and Gynecology

## 2017-02-21 LAB — HIV ANTIBODY (ROUTINE TESTING W REFLEX): HIV SCREEN 4TH GENERATION: NONREACTIVE

## 2017-02-21 LAB — HEMOGLOBIN AND HEMATOCRIT, BLOOD
HCT: 33.4 % — ABNORMAL LOW (ref 35.0–47.0)
Hemoglobin: 11.4 g/dL — ABNORMAL LOW (ref 12.0–16.0)

## 2017-02-21 LAB — RPR: RPR: NONREACTIVE

## 2017-02-21 SURGERY — Surgical Case
Anesthesia: General | Site: Abdomen | Wound class: Clean Contaminated

## 2017-02-21 MED ORDER — FENTANYL 2.5 MCG/ML W/ROPIVACAINE 0.2% IN NS 100 ML EPIDURAL INFUSION (ARMC-ANES)
EPIDURAL | Status: AC
  Filled 2017-02-21: qty 100

## 2017-02-21 MED ORDER — CEFAZOLIN SODIUM-DEXTROSE 2-3 GM-% IV SOLR
2.0000 g | Freq: Once | INTRAVENOUS | Status: DC
  Filled 2017-02-21: qty 50

## 2017-02-21 MED ORDER — ACETAMINOPHEN 325 MG PO TABS
650.0000 mg | ORAL_TABLET | ORAL | Status: DC | PRN
  Administered 2017-02-22 – 2017-02-23 (×3): 650 mg via ORAL
  Filled 2017-02-21 (×3): qty 2

## 2017-02-21 MED ORDER — FERROUS SULFATE 325 (65 FE) MG PO TABS
325.0000 mg | ORAL_TABLET | Freq: Two times a day (BID) | ORAL | Status: DC
  Administered 2017-02-22 – 2017-02-23 (×3): 325 mg via ORAL
  Filled 2017-02-21 (×3): qty 1

## 2017-02-21 MED ORDER — MORPHINE SULFATE (PF) 0.5 MG/ML IJ SOLN
INTRAMUSCULAR | Status: DC | PRN
  Administered 2017-02-21: .2 mg via INTRATHECAL

## 2017-02-21 MED ORDER — PHENYLEPHRINE HCL 10 MG/ML IJ SOLN
INTRAMUSCULAR | Status: DC | PRN
  Administered 2017-02-21 (×4): 100 ug via INTRAVENOUS

## 2017-02-21 MED ORDER — DEXTROSE 5 % IV SOLN
500.0000 mg | Freq: Once | INTRAVENOUS | Status: AC
  Administered 2017-02-21: 500 mg via INTRAVENOUS
  Filled 2017-02-21: qty 500

## 2017-02-21 MED ORDER — SODIUM CHLORIDE 0.9% FLUSH
3.0000 mL | INTRAVENOUS | Status: DC | PRN
  Administered 2017-02-22: 3 mL via INTRAVENOUS
  Filled 2017-02-21: qty 3

## 2017-02-21 MED ORDER — BUPIVACAINE HCL (PF) 0.5 % IJ SOLN: 30.0000 mL | Freq: Once | INTRAMUSCULAR | Status: DC

## 2017-02-21 MED ORDER — SODIUM CHLORIDE 0.9 % IV SOLN: 250.0000 mL | INTRAVENOUS | Status: DC

## 2017-02-21 MED ORDER — OXYTOCIN 40 UNITS IN LACTATED RINGERS INFUSION - SIMPLE MED
1.0000 m[IU]/min | INTRAVENOUS | Status: DC
  Administered 2017-02-21: 3 m[IU]/min via INTRAVENOUS
  Administered 2017-02-21: 1 m[IU]/min via INTRAVENOUS
  Filled 2017-02-21: qty 1000

## 2017-02-21 MED ORDER — SIMETHICONE 80 MG PO CHEW
160.0000 mg | CHEWABLE_TABLET | Freq: Four times a day (QID) | ORAL | Status: DC | PRN
Start: 2017-02-21 — End: 2017-02-23
  Administered 2017-02-23: 160 mg via ORAL
  Filled 2017-02-21: qty 2

## 2017-02-21 MED ORDER — BUPIVACAINE LIPOSOME 1.3 % IJ SUSP
INTRAMUSCULAR | Status: DC | PRN
  Administered 2017-02-21: 100 mL

## 2017-02-21 MED ORDER — SUCCINYLCHOLINE CHLORIDE 20 MG/ML IJ SOLN
INTRAMUSCULAR | Status: DC | PRN
  Administered 2017-02-21: 100 mg via INTRAVENOUS

## 2017-02-21 MED ORDER — FENTANYL 2.5 MCG/ML W/ROPIVACAINE 0.2% IN NS 100 ML EPIDURAL INFUSION (ARMC-ANES)
10.0000 mL/h | EPIDURAL | Status: DC
  Administered 2017-02-21: 20 mL/h via EPIDURAL
  Administered 2017-02-21: 10 mL/h via EPIDURAL

## 2017-02-21 MED ORDER — NALOXONE HCL 2 MG/2ML IJ SOSY
1.0000 ug/kg/h | PREFILLED_SYRINGE | INTRAVENOUS | Status: DC | PRN
  Filled 2017-02-21: qty 2

## 2017-02-21 MED ORDER — VASOPRESSIN 20 UNIT/ML IV SOLN
INTRAVENOUS | Status: AC
  Filled 2017-02-21: qty 1

## 2017-02-21 MED ORDER — ACETAMINOPHEN 650 MG RE SUPP
650.0000 mg | Freq: Once | RECTAL | Status: AC
Start: 2017-02-21 — End: 2017-02-21
  Administered 2017-02-21: 650 mg via RECTAL
  Filled 2017-02-21: qty 1

## 2017-02-21 MED ORDER — DIPHENHYDRAMINE HCL 25 MG PO CAPS
25.0000 mg | ORAL_CAPSULE | Freq: Four times a day (QID) | ORAL | Status: DC | PRN
Start: 2017-02-21 — End: 2017-02-23

## 2017-02-21 MED ORDER — PROPOFOL 10 MG/ML IV BOLUS
INTRAVENOUS | Status: AC
  Filled 2017-02-21: qty 20

## 2017-02-21 MED ORDER — ONDANSETRON HCL 4 MG/2ML IJ SOLN
4.0000 mg | Freq: Four times a day (QID) | INTRAMUSCULAR | Status: DC | PRN
  Administered 2017-02-21: 4 mg via INTRAVENOUS

## 2017-02-21 MED ORDER — BUPIVACAINE HCL (PF) 0.25 % IJ SOLN
INTRAMUSCULAR | Status: DC | PRN
  Administered 2017-02-21 (×2): 4 mL via EPIDURAL

## 2017-02-21 MED ORDER — BUPIVACAINE HCL (PF) 0.5 % IJ SOLN
INTRAMUSCULAR | Status: AC
  Filled 2017-02-21: qty 30

## 2017-02-21 MED ORDER — LIDOCAINE-EPINEPHRINE (PF) 1.5 %-1:200000 IJ SOLN
INTRAMUSCULAR | Status: DC | PRN
  Administered 2017-02-21: 3 mL via PERINEURAL

## 2017-02-21 MED ORDER — DIPHENHYDRAMINE HCL 25 MG PO CAPS: 25.0000 mg | ORAL_CAPSULE | ORAL | Status: DC | PRN

## 2017-02-21 MED ORDER — WITCH HAZEL-GLYCERIN EX PADS: 1.0000 "application " | MEDICATED_PAD | CUTANEOUS | Status: DC | PRN

## 2017-02-21 MED ORDER — DIBUCAINE 1 % RE OINT: 1.0000 "application " | TOPICAL_OINTMENT | RECTAL | Status: DC | PRN

## 2017-02-21 MED ORDER — NALBUPHINE HCL 10 MG/ML IJ SOLN: 5.0000 mg | Freq: Once | INTRAMUSCULAR | Status: DC | PRN

## 2017-02-21 MED ORDER — MENTHOL 3 MG MT LOZG
1.0000 | LOZENGE | OROMUCOSAL | Status: DC | PRN
  Filled 2017-02-21: qty 9

## 2017-02-21 MED ORDER — FENTANYL CITRATE (PF) 100 MCG/2ML IJ SOLN
INTRAMUSCULAR | Status: DC | PRN
  Administered 2017-02-21: 20 ug via INTRATHECAL

## 2017-02-21 MED ORDER — COCONUT OIL OIL
1.0000 "application " | TOPICAL_OIL | Status: DC | PRN
  Administered 2017-02-22: 1 via TOPICAL
  Filled 2017-02-21: qty 120

## 2017-02-21 MED ORDER — FENTANYL CITRATE (PF) 100 MCG/2ML IJ SOLN
INTRAMUSCULAR | Status: AC
  Filled 2017-02-21: qty 2

## 2017-02-21 MED ORDER — SODIUM CHLORIDE 0.9 % IV SOLN
INTRAVENOUS | Status: DC | PRN
  Administered 2017-02-21: 25 ug/min via INTRAVENOUS

## 2017-02-21 MED ORDER — EPHEDRINE SULFATE 50 MG/ML IJ SOLN
INTRAMUSCULAR | Status: DC | PRN
  Administered 2017-02-21: 10 mg via INTRAVENOUS

## 2017-02-21 MED ORDER — SENNOSIDES-DOCUSATE SODIUM 8.6-50 MG PO TABS: 2.0000 | ORAL_TABLET | ORAL | Status: DC

## 2017-02-21 MED ORDER — CEFAZOLIN SODIUM-DEXTROSE 2-4 GM/100ML-% IV SOLN: 2.0000 g | Freq: Once | INTRAVENOUS | Status: DC

## 2017-02-21 MED ORDER — PROPOFOL 10 MG/ML IV BOLUS
INTRAVENOUS | Status: DC | PRN
  Administered 2017-02-21: 140 mg via INTRAVENOUS

## 2017-02-21 MED ORDER — PRENATAL MULTIVITAMIN CH
1.0000 | ORAL_TABLET | Freq: Every day | ORAL | Status: DC
  Administered 2017-02-22: 1 via ORAL
  Filled 2017-02-21: qty 1

## 2017-02-21 MED ORDER — MORPHINE SULFATE (PF) 0.5 MG/ML IJ SOLN
INTRAMUSCULAR | Status: AC
  Filled 2017-02-21: qty 10

## 2017-02-21 MED ORDER — LACTATED RINGERS IV SOLN
INTRAVENOUS | Status: DC
  Administered 2017-02-21 – 2017-02-22 (×3): via INTRAVENOUS

## 2017-02-21 MED ORDER — LACTATED RINGERS IV SOLN
INTRAVENOUS | Status: DC | PRN
  Administered 2017-02-21: 19:00:00 via INTRAVENOUS

## 2017-02-21 MED ORDER — SODIUM CHLORIDE 0.9% FLUSH
3.0000 mL | Freq: Two times a day (BID) | INTRAVENOUS | Status: DC
  Administered 2017-02-22: 3 mL via INTRAVENOUS

## 2017-02-21 MED ORDER — LIDOCAINE HCL (PF) 1 % IJ SOLN
INTRAMUSCULAR | Status: DC | PRN
  Administered 2017-02-21 (×4): 3 mL

## 2017-02-21 MED ORDER — OXYTOCIN 40 UNITS IN LACTATED RINGERS INFUSION - SIMPLE MED
INTRAVENOUS | Status: DC | PRN
  Administered 2017-02-21: 899 mL via INTRAVENOUS
  Administered 2017-02-21: 1 mL via INTRAVENOUS

## 2017-02-21 MED ORDER — MIDAZOLAM HCL 2 MG/2ML IJ SOLN
INTRAMUSCULAR | Status: AC
  Filled 2017-02-21: qty 2

## 2017-02-21 MED ORDER — MIDAZOLAM HCL 2 MG/2ML IJ SOLN
INTRAMUSCULAR | Status: DC | PRN
  Administered 2017-02-21: 1 mg via INTRAVENOUS
  Administered 2017-02-21: 2 mg via INTRAVENOUS

## 2017-02-21 MED ORDER — NALBUPHINE HCL 10 MG/ML IJ SOLN: 5.0000 mg | INTRAMUSCULAR | Status: DC | PRN

## 2017-02-21 MED ORDER — DIPHENHYDRAMINE HCL 50 MG/ML IJ SOLN: 12.5000 mg | INTRAMUSCULAR | Status: DC | PRN

## 2017-02-21 MED ORDER — SODIUM CHLORIDE 0.9 % IJ SOLN
INTRAMUSCULAR | Status: AC
  Filled 2017-02-21: qty 50

## 2017-02-21 MED ORDER — VASOPRESSIN 20 UNIT/ML IV SOLN
INTRAVENOUS | Status: DC | PRN
  Administered 2017-02-21 (×2): 1 [IU] via INTRAVENOUS

## 2017-02-21 MED ORDER — BUPIVACAINE LIPOSOME 1.3 % IJ SUSP
20.0000 mL | Freq: Once | INTRAMUSCULAR | Status: DC
  Filled 2017-02-21: qty 20

## 2017-02-21 MED ORDER — FENTANYL 2.5 MCG/ML W/ROPIVACAINE 0.2% IN NS 100 ML EPIDURAL INFUSION (ARMC-ANES)
EPIDURAL | Status: DC | PRN
  Administered 2017-02-21: 10 mL/h via EPIDURAL

## 2017-02-21 MED ORDER — NALOXONE HCL 0.4 MG/ML IJ SOLN: 0.4000 mg | INTRAMUSCULAR | Status: DC | PRN

## 2017-02-21 MED ORDER — SENNOSIDES-DOCUSATE SODIUM 8.6-50 MG PO TABS
2.0000 | ORAL_TABLET | ORAL | Status: DC
  Administered 2017-02-22 – 2017-02-23 (×2): 2 via ORAL
  Filled 2017-02-21 (×2): qty 2

## 2017-02-21 MED ORDER — OXYCODONE HCL 5 MG PO TABS: 10.0000 mg | ORAL_TABLET | ORAL | Status: DC | PRN

## 2017-02-21 MED ORDER — IBUPROFEN 600 MG PO TABS
600.0000 mg | ORAL_TABLET | Freq: Four times a day (QID) | ORAL | Status: DC
  Administered 2017-02-22 (×3): 600 mg via ORAL
  Filled 2017-02-21 (×3): qty 1

## 2017-02-21 MED ORDER — BUPIVACAINE IN DEXTROSE 0.75-8.25 % IT SOLN
INTRATHECAL | Status: DC | PRN
  Administered 2017-02-21: 1.4 mL via INTRATHECAL

## 2017-02-21 MED ORDER — OXYTOCIN 40 UNITS IN LACTATED RINGERS INFUSION - SIMPLE MED
2.5000 [IU]/h | INTRAVENOUS | Status: DC
  Filled 2017-02-21: qty 1000

## 2017-02-21 MED ORDER — BUPIVACAINE LIPOSOME 1.3 % IJ SUSP
INTRAMUSCULAR | Status: AC
  Filled 2017-02-21: qty 20

## 2017-02-21 MED ORDER — SODIUM CHLORIDE 0.9% FLUSH: 3.0000 mL | INTRAVENOUS | Status: DC | PRN

## 2017-02-21 MED ORDER — OXYCODONE HCL 5 MG PO TABS
5.0000 mg | ORAL_TABLET | ORAL | Status: DC | PRN
  Administered 2017-02-22 – 2017-02-23 (×3): 5 mg via ORAL
  Filled 2017-02-21 (×3): qty 1

## 2017-02-21 SURGICAL SUPPLY — 42 items
BARRIER ADHS 3X4 INTERCEED (GAUZE/BANDAGES/DRESSINGS) ×3 IMPLANT
CANISTER SUCT 3000ML (MISCELLANEOUS) ×3 IMPLANT
CATH KIT ON-Q SILVERSOAK 5IN (CATHETERS) IMPLANT
CLOSURE WOUND 1/2 X4 (GAUZE/BANDAGES/DRESSINGS)
COUNTER NEEDLE 20/40 LG (NEEDLE) ×3 IMPLANT
DERMABOND ADVANCED (GAUZE/BANDAGES/DRESSINGS) ×2
DERMABOND ADVANCED .7 DNX12 (GAUZE/BANDAGES/DRESSINGS) ×1 IMPLANT
DRSG TELFA 3X8 NADH (GAUZE/BANDAGES/DRESSINGS) IMPLANT
ELECT CAUTERY BLADE 6.4 (BLADE) ×3 IMPLANT
ELECT REM PT RETURN 9FT ADLT (ELECTROSURGICAL) ×3
ELECTRODE REM PT RTRN 9FT ADLT (ELECTROSURGICAL) ×1 IMPLANT
GAUZE SPONGE 4X4 12PLY STRL (GAUZE/BANDAGES/DRESSINGS) IMPLANT
GLOVE PI ORTHOPRO 6.5 (GLOVE) ×4
GLOVE PI ORTHOPRO STRL 6.5 (GLOVE) ×2 IMPLANT
GLOVE SURG SYN 6.5 ES PF (GLOVE) IMPLANT
GOWN STRL REUS W/ TWL LRG LVL3 (GOWN DISPOSABLE) ×3 IMPLANT
GOWN STRL REUS W/TWL LRG LVL3 (GOWN DISPOSABLE) ×6
HEMOSTAT SURGICEL 2X14 (HEMOSTASIS) ×3 IMPLANT
LIQUID BAND (GAUZE/BANDAGES/DRESSINGS) IMPLANT
NEEDLE HYPO 22GX1.5 SAFETY (NEEDLE) ×3 IMPLANT
NS IRRIG 1000ML POUR BTL (IV SOLUTION) ×3 IMPLANT
PACK C SECTION AR (MISCELLANEOUS) ×3 IMPLANT
PAD OB MATERNITY 4.3X12.25 (PERSONAL CARE ITEMS) ×3 IMPLANT
PAD PREP 24X41 OB/GYN DISP (PERSONAL CARE ITEMS) ×3 IMPLANT
SPONGE LAP 18X18 5 PK (GAUZE/BANDAGES/DRESSINGS) ×9 IMPLANT
SPONGE XRAY 4X4 16PLY STRL (MISCELLANEOUS) ×3 IMPLANT
STRAP SAFETY BODY (MISCELLANEOUS) ×3 IMPLANT
STRIP CLOSURE SKIN 1/2X4 (GAUZE/BANDAGES/DRESSINGS) IMPLANT
SUT MNCRL 4-0 (SUTURE) ×2
SUT MNCRL 4-0 27XMFL (SUTURE) ×1
SUT PDS AB 1 TP1 96 (SUTURE) IMPLANT
SUT VIC AB 0 CT1 36 (SUTURE) ×12 IMPLANT
SUT VIC AB 0 CTX 36 (SUTURE) ×4
SUT VIC AB 0 CTX36XBRD ANBCTRL (SUTURE) ×2 IMPLANT
SUT VIC AB 2-0 CT1 27 (SUTURE)
SUT VIC AB 2-0 CT1 TAPERPNT 27 (SUTURE) IMPLANT
SUT VIC AB 3-0 SH 27 (SUTURE) ×4
SUT VIC AB 3-0 SH 27X BRD (SUTURE) ×2 IMPLANT
SUTURE MNCRL 4-0 27XMF (SUTURE) ×1 IMPLANT
SWABSTK COMLB BENZOIN TINCTURE (MISCELLANEOUS) IMPLANT
SYR 30ML LL (SYRINGE) ×9 IMPLANT
TOWEL OR 17X26 4PK STRL BLUE (TOWEL DISPOSABLE) ×3 IMPLANT

## 2017-02-21 NOTE — Anesthesia Preprocedure Evaluation (Signed)
Anesthesia Evaluation  Patient identified by MRN, date of birth, ID band Patient awake    Reviewed: Allergy & Precautions, H&P , NPO status , Patient's Chart, lab work & pertinent test results  History of Anesthesia Complications Negative for: history of anesthetic complications  Airway Mallampati: II  TM Distance: >3 FB Neck ROM: full    Dental no notable dental hx.    Pulmonary neg pulmonary ROS,    Pulmonary exam normal        Cardiovascular negative cardio ROS Normal cardiovascular exam     Neuro/Psych negative neurological ROS  negative psych ROS   GI/Hepatic negative GI ROS, Neg liver ROS,   Endo/Other  negative endocrine ROS  Renal/GU negative Renal ROS  negative genitourinary   Musculoskeletal   Abdominal   Peds  Hematology negative hematology ROS (+)   Anesthesia Other Findings Scoliosis noted  Reproductive/Obstetrics (+) Pregnancy                             Anesthesia Physical Anesthesia Plan  ASA: II  Anesthesia Plan: Epidural   Post-op Pain Management:    Induction:   Airway Management Planned:   Additional Equipment:   Intra-op Plan:   Post-operative Plan:   Informed Consent: I have reviewed the patients History and Physical, chart, labs and discussed the procedure including the risks, benefits and alternatives for the proposed anesthesia with the patient or authorized representative who has indicated his/her understanding and acceptance.     Plan Discussed with: Anesthesiologist  Anesthesia Plan Comments:         Anesthesia Quick Evaluation

## 2017-02-21 NOTE — Progress Notes (Signed)
Late entry  Was called at 11:29 with request to come to bedside ASAP  Upon arrival, patient was in R lateral position, O2 applied, Pitocin off and IV fluid bolus running. Fetal hearts had decelerated once to 80 ~32minute then again to 80 for ~4 minutes with recovery  Baseline before was 150 and after was 140, with moderate variability +accels.  BP: 112/73 on monitor  Cervix unchanged 5/70/-2  Pitocin off, remain off for 30 minutes and restart if fetal hearts remain category 1 during that time.  ----- Larey Days, MD Attending Obstetrician and Gynecologist [facility], Department of [facility]

## 2017-02-21 NOTE — Anesthesia Postprocedure Evaluation (Signed)
Anesthesia Post Note  Patient: Carolyn Lopez  Procedure(s) Performed: Procedure(s) (LRB): CESAREAN SECTION (N/A)  Patient location during evaluation: PACU Anesthesia Type: General Level of consciousness: awake and alert Pain management: pain level controlled Vital Signs Assessment: post-procedure vital signs reviewed and stable Respiratory status: spontaneous breathing, nonlabored ventilation, respiratory function stable and patient connected to nasal cannula oxygen Cardiovascular status: blood pressure returned to baseline and stable Postop Assessment: no signs of nausea or vomiting Anesthetic complications: no     Last Vitals:  Vitals:   02/21/17 2147 02/21/17 2210  BP: 102/72 100/67  Pulse: 74 67  Resp: 16 18  Temp:  36.8 C    Last Pain:  Vitals:   02/21/17 2215  TempSrc:   PainSc: 0-No pain                 Molli Barrows

## 2017-02-21 NOTE — Anesthesia Post-op Follow-up Note (Cosign Needed)
Anesthesia QCDR form completed.        

## 2017-02-21 NOTE — Transfer of Care (Signed)
Immediate Anesthesia Transfer of Care Note  Patient: Carolyn Lopez  Procedure(s) Performed: Procedure(s) with comments: CESAREAN SECTION (N/A) - epidural, spinal, general  Patient Location: PACU  Anesthesia Type:General  Level of Consciousness: awake, alert , oriented and patient cooperative  Airway & Oxygen Therapy: Patient Spontanous Breathing and Patient connected to nasal cannula oxygen  Post-op Assessment: Report given to RN and Post -op Vital signs reviewed and stable  Post vital signs: Reviewed and stable  Last Vitals:  Vitals:   02/21/17 1623 02/21/17 1823  BP: 107/71 124/83  Pulse: 60 66  Resp:    Temp:      Last Pain:  Vitals:   02/21/17 1530  TempSrc: Oral  PainSc:          Complications: No apparent anesthesia complications

## 2017-02-21 NOTE — Progress Notes (Signed)
Intrapartum progress note:  S: feeling contractions, more comfortable on ball/standing  O: 112/80, P71, 98% RA, T 97.8  FHT 120 mod + accels no decels TOCO q1-3 min on pitocin 63miu SVE: foley bulb removed, 5/70/-2, AROM for clear blood-tinged fluid  A/P: 41yo G1P0 @ 40.2 with IOL for polyhydramnios, past due date  1. Continue IOL with pitocin after AROM 2. IUP: category 1 tracing 3. Continue active management.  ----- Larey Days, MD Attending Obstetrician and Gynecologist [facility], Department of [facility]

## 2017-02-21 NOTE — Anesthesia Procedure Notes (Signed)
Epidural Patient location during procedure: OB Start time: 02/21/2017 9:24 AM End time: 02/21/2017 9:30 AM  Staffing Anesthesiologist: Andria Frames Resident/CRNA: Doreen Salvage Performed: anesthesiologist and resident/CRNA   Preanesthetic Checklist Completed: patient identified, site marked, surgical consent, pre-op evaluation, timeout performed, IV checked, risks and benefits discussed and monitors and equipment checked  Epidural Patient position: sitting Prep: Betadine Patient monitoring: heart rate, continuous pulse ox and blood pressure Approach: midline Location: L5-S1 Injection technique: LOR saline  Needle:  Needle type: Tuohy  Needle gauge: 17 G Needle length: 9 cm and 9 Needle insertion depth: 7 cm Catheter type: closed end flexible Catheter size: 19 Gauge Catheter at skin depth: 12 cm Test dose: negative and 1.5% lidocaine with Epi 1:200 K  Assessment Sensory level: T10 Events: blood not aspirated, injection not painful, no injection resistance, negative IV test and no paresthesia  Additional Notes Initial placement attempts by L. Tyjay Galindo unsuccessful x3 at L-3-4, L4-5. Dr. Amie Critchley notified of the difficulty in epidural placement. Pt. Evaluated and documentation done after procedure finished. Patient identified. Risks/Benefits/Options discussed with patient including but not limited to bleeding, infection, nerve damage, paralysis, failed block, incomplete pain control, headache, blood pressure changes, nausea, vomiting, reactions to medication both or allergic, itching and postpartum back pain. Confirmed with bedside nurse the patient's most recent platelet count. Confirmed with patient that they are not currently taking any anticoagulation, have any bleeding history or any family history of bleeding disorders. Patient expressed understanding and wished to proceed. All questions were answered. Sterile technique was used throughout the entire procedure. Please  see nursing notes for vital signs. Test dose was given through epidural catheter and negative prior to continuing to dose epidural or start infusion. Warning signs of high block given to the patient including shortness of breath, tingling/numbness in hands, complete motor block, or any concerning symptoms with instructions to call for help. Patient was given instructions on fall risk and not to get out of bed. All questions and concerns addressed with instructions to call with any issues or inadequate analgesia.   Patient tolerated the insertion well without immediate complications. Reason for block:procedure for pain

## 2017-02-21 NOTE — Anesthesia Procedure Notes (Signed)
Procedure Name: Intubation Date/Time: 02/21/2017 7:40 PM Performed by: Lendon Colonel Pre-anesthesia Checklist: Patient identified, Patient being monitored, Timeout performed, Emergency Drugs available and Suction available Patient Re-evaluated:Patient Re-evaluated prior to inductionOxygen Delivery Method: Circle system utilized Preoxygenation: Pre-oxygenation with 100% oxygen Intubation Type: IV induction, Cricoid Pressure applied and Rapid sequence Laryngoscope Size: Mac and 3 Grade View: Grade I Tube type: Oral Tube size: 7.0 mm Number of attempts: 1 Airway Equipment and Method: Stylet Placement Confirmation: ETT inserted through vocal cords under direct vision,  positive ETCO2 and breath sounds checked- equal and bilateral Secured at: 21 cm Tube secured with: Tape Dental Injury: Teeth and Oropharynx as per pre-operative assessment

## 2017-02-21 NOTE — Op Note (Addendum)
Cesarean Section Procedure Note  02/19/2017 - 02/21/2017 Date of service:  02/21/17   Patient:  Carolyn Lopez  41 y.o. female at [redacted]w[redacted]d.  Patient's last menstrual period was 05/09/2016 (approximate). Preoperative diagnosis:  fetal intolerance to labor, failed labor induction, fibroid uterus Postoperative diagnosis:  fetal intolerance to labor, failed labor induction, fibroid uterus, cephalopelvic disproportion  PROCEDURE:  Procedure(s) with comments: CESAREAN SECTION (N/A) - epidural, spinal, general, myomectomy Surgeon:  Surgeon(s) and Role:    * Criss Pallone C Bronislaus Verdell, MD - Primary Anesthesia:  Spinal converted to GET I/O: Total I/O In: -  Out: 1100 [Urine:100; Blood:1000] Specimens:  Fibroid, cord blood collected for donation Complications: None Apparent Disposition:  VS stable to PACU  Findings: large globular uterus, with 4cm pedunculated posterior fibroid, small intramural and serosal fibroids, normal tubes and ovaries bilaterally, very sharply angled sacral promontory (90 degrees) Live born female "Estill Bamberg", direct OP Birth Weight: 7 lb 5.8 oz (3340 g) APGAR: 8, 9   Indication for procedure: 41 y.o. female at [redacted]w[redacted]d who had been undergoing induction of labor for AMA, polyhydramnios, and past due date.  She had cervical ripening with prostaglandins, foley bulb, and pitocin, AROM and did not progress beyond 5cm with several prolonged decelerations with adequate recovery.  IUPC was inserted, with inadequate contraction strength noted.  Mom and dad discussed lack of progress and elected to have a cesarean.  Procedure Details   The risks, benefits, complications, treatment options, and expected outcomes were discussed with the patient. Informed consent was obtained. The patient was taken to Operating Room, identified as Carolyn Lopez and the procedure verified as a cesarean delivery.   The patient had an epidural that was not adequately covering her left side.  The catheter was removed and a  spinal was given.  The patient complained of difficulty breathing, and due to poor strength of the upper extremities a high-spinal was diagnosed and anesthesia team decided to convert to general and proceed STAT.  The patient had already been prepped, thus she was draped in the usual sterile manner. A surgical time out was NOT performed. The pediatric team was present. After confirming adequate airway, a Pfannenstiel incision was made and carried down through the subcutaneous tissue to the fascia. Fascial incision was made and extended transversely. The rectus muscles were manually divided midline and the peritoneum was identified and entered. Peritoneal incision was extended longitudinally. A bladder blade was inserted, and a low transverse uterine incision was made. The fetal head was wedged in the pelvis, as though had been through a 3-hour second stage, and was direct OP.  Delivered from cephalic presentation was a live born female, with delivery in 64 seconds from skin incision. Delayed cord clamping was not performed. The umbilical cord was doubly clamped and cut, and the baby was handed off to the awaitng pediatrician.  Cord blood was collected for [facility] collection. The placenta was removed intact and appeared normal. The uterus was delivered from the abdominal cavity and cleared of clots, membranes, and debris. The uterus, tubes and ovaries appeared normal. The uterine incision was closed with running locking sutures of 0 Vicryl, and then a second, imbricating stitch was placed. Hemostasis was observed. The posterior pedunculated fibroid was transected at its pedicle with the bovie and the serosa and myometrium were reapproximated with 0-Vicryl.  Surgicel was stitched into the serosa.  The abdominal cavity was evacuated of extraneous fluid. The uterus was returned to the abdominal cavity and again the incision was inspected  for hemostasis, which was confirmed.  The paracolic gutters were  cleaned. Intercede was placed over the uterine incision. The fascia was then reapproximated with running suture of vicryl. 60cc of Long- and short-acting bupivicaine was injected circumferentially into the fascia.  After a change of gloves, the subcutaneous tissue was irrigated and reapproximated with 3-0 vicryl. The skin was closed with 4-0 Monocryl and 40cc of long- and short-acting bupivacaine injected into the skin and subcutaneous tissues.  The incision was covered with surgical glue.    Instrument, sponge, and needle counts were correct prior the abdominal closure and at the conclusion of the case.   I was present and performed this procedure in its entirety.  A safety report was made due to not performing the Time-Out prior to start of the case.  This was deemed an emergency case once there was respiratory compromise/high spinal.  ----- Larey Days, MD Attending Obstetrician and Gynecologist [facility], Department of [facility]

## 2017-02-21 NOTE — Progress Notes (Signed)
Late entry  Notified by RN that patient and husband discussed current situation and lack of progress, lack of tolerable position for baby, lack of comfort with one-sided epidural, and wish to have a cesarean.   Cesarean delivery called, will notify anesthesia, OR staff, peds, and L&D staff and proceed when ready.  Patient had previously been discussed risks and benefits of surgery, and consents to procedure.  ----- Larey Days, MD Attending Obstetrician and Gynecologist [facility], Department of [facility]

## 2017-02-21 NOTE — Discharge Instructions (Signed)

## 2017-02-21 NOTE — Progress Notes (Signed)
Intrapartum progress note:  Patient feeling contractions on left side; Dr. Andree Elk at bedside giving bolus. She is frustrated with lack of progress, losing motivation to continue  O: 131/84 P66  FHT 145 mod + accels no decels Toco: q3-66min pit at 3 SVE: 5/90/-1, IUPC placed  A/P: 41yo G1P0 @ 40.2 with IOL for AMA, past due date, and polyhydramnios and slow progress   1. IOL: s/p cytotec, foley bulb, AROM and pitocin, now with IUPC to assess adequacy of contractions.  Calculate montevideo units. Essentially this is our last option for hopes for a vaginal delivery.  Will assess for progression and if none or minimal, will recommend cesarean.  We have exhausted other options. 2. IUP: with peppered decelerations, she remains a category 1 at this time.  With moderate variability and spontaneous accelerations, no acidemia is suggested. 3. Will reassess PRN.    ----- Larey Days, MD Attending Obstetrician and Gynecologist [facility], Department of [facility]

## 2017-02-21 NOTE — Discharge Summary (Signed)
Obstetrical Discharge Summary  Patient Name: Carolyn Lopez DOB: March 28, 1976 MRN: [id number]  Date of Admission: 02/19/2017 Date of Delivery: 02/21/2017 Delivered by: Larey Days, MD Date of Discharge: 02/23/2017  Primary OB: [facility]   [id number] last menstrual period was 05/09/2016 (approximate). EDC Estimated Date of Delivery: 02/19/17 Gestational Age at Delivery: [redacted]w[redacted]d   Antepartum complications:   1. Polyhydramnios 2. Unstable fetal lie - was breech, transverse, and cephalic several times in last 4 weeks  3. AMA 4. Family history of Trisomy 21, neg NIPT 5. Scoliosis 6. Had Influenza A this pregnancy 7. GBS positive 8. 5cm posterior fibroid  Admitting Diagnosis: induction of labor due to past due date, polyhydramnios, AMA Secondary Diagnosis: Patient Active Problem List   Diagnosis Date Noted  . Acute blood loss as cause of postoperative anemia 02/22/2017  . Pregnancy 02/20/2017  . Indication for care in labor or delivery 02/19/2017  . Labor and delivery, indication for care 02/13/2017  . Advanced maternal age, 1st pregnancy   . Family history of Down syndrome     Augmentation: AROM, Pitocin, Cytotec and Foley Balloon Complications: None Intrapartum complications/course: 41 y.o. female at [redacted]w[redacted]d who had been undergoing induction of labor for AMA, polyhydramnios, and past due date.  She had cervical ripening with prostaglandins, foley bulb, and pitocin, AROM and did not progress beyond 5cm with several prolonged decelerations with adequate recovery.  IUPC was inserted, with inadequate contraction strength noted.  Mom and dad discussed lack of progress and elected to have a cesarean. Date of Delivery: 02/21/17 Delivered By: Vikki Ports Ward Delivery Type: primary cesarean section, low transverse incision Anesthesia: epidural, spinal, GET Placenta: expressed Laceration: n/a Episiotomy: none Newborn Data: Live born female "Carolyn Lopez" Birth Weight: 7 lb 5.8 oz  (3340 g) APGAR: 8, 9  Postpartum Procedures: None  Post partum course:  Patient had an uncomplicated postpartum course.  By time of discharge on POD#2, her pain was controlled on oral pain medications; she had appropriate lochia and was ambulating, voiding without difficulty, tolerating regular diet and passing flatus.   She was deemed stable for discharge to home.    Discharge Physical Exam:  BP 109/84   Pulse 86   Temp 97.8 F (36.6 C) (Oral)   Resp 20   Ht 5\' 3"  (1.6 m)   Wt 195 lb (88.5 kg)   LMP 05/09/2016 (Approximate)   SpO2 97%   Breastfeeding? Unknown   BMI 34.54 kg/m   General: NAD CV: RRR Pulm: CTABL, nl effort ABD: s/nd/nt, fundus firm and below the umbilicus Lochia: moderate Incision: c/d/i DVT Evaluation: LE non-ttp, no evidence of DVT on exam. +2+ edema  Hemoglobin  Date Value Ref Range Status  02/22/2017 9.4 (L) 12.0 - 16.0 g/dL Final   HCT  Date Value Ref Range Status  02/22/2017 26.9 (L) 35.0 - 47.0 % Final     Disposition: stable, discharge to home. Baby Feeding: breastmilk  Baby Disposition: home with mom  Rh Immune globulin given: n/a Rubella vaccine given: n/a Tdap vaccine given in AP or PP setting: AP Flu vaccine given in AP or PP setting: AP  Contraception: vasectomy  Prenatal Labs:  Blood type/Rh A pos  Antibody screen neg  Rubella Immune  Varicella Immune  RPR NR  HBsAg Neg  HIV NR  GC neg  Chlamydia neg  Genetic screening negative  1 hour GTT 106  3 hour GTT --  GBS Positive       Plan:  Carolyn Lopez was discharged to home  in good condition. Follow-up appointment at [facility] with Dr. Leonides Schanz in 2 weeks   Discharge Medications: Allergies as of 02/23/2017   No Known Allergies     Medication List    TAKE these medications   acetaminophen 500 MG tablet Commonly known as:  TYLENOL Take 2 tablets (1,000 mg total) by mouth every 8 (eight) hours.   ferrous sulfate 325 (65 FE) MG tablet Take 325 mg by  mouth daily with breakfast.   ibuprofen 800 MG tablet Commonly known as:  ADVIL,MOTRIN Take 1 tablet (800 mg total) by mouth 3 (three) times daily.   MAGNESIUM CITRATE PO Take 2 tablets by mouth daily.   oxycodone 5 MG capsule Commonly known as:  OXY-IR Take 1 capsule (5 mg total) by mouth every 6 (six) hours as needed for pain.   prenatal multivitamin Tabs tablet Take 1 tablet by mouth daily at 12 noon.       Follow-up [place], MD Follow up in 2 week(s).   Specialty:  Obstetrics and Gynecology Why:  for incision check Contact information: [address] [place] [address] [phone]           Signed: Benjaman Kindler 9:00 AM

## 2017-02-21 NOTE — Anesthesia Procedure Notes (Signed)
Spinal  Patient location during procedure: OB Staffing Performed: anesthesiologist  Preanesthetic Checklist Completed: patient identified, site marked, surgical consent, pre-op evaluation, timeout performed, IV checked, risks and benefits discussed and monitors and equipment checked Spinal Block Patient position: sitting Prep: Betadine Patient monitoring: heart rate, cardiac monitor, continuous pulse ox and blood pressure Approach: midline Location: L4-5 Injection technique: single-shot Needle Needle type: Whitacre  Needle gauge: 27 G Needle length: 9 cm Assessment Sensory level: T4 Additional Notes SAB placed without difficulty.  +CSF No Heme.  No paresthesia.  VSST and tolerated well.  JA

## 2017-02-21 NOTE — H&P (Signed)
Carolyn Lopez is a 41 y.o. female presenting for active labor .G4P3 active ctx  OB History    Gravida Para Term Preterm AB Living   1             SAB TAB Ectopic Multiple Live Births                 History reviewed. No pertinent past medical history. History reviewed. No pertinent surgical history. Family History: family history includes Cancer in her maternal grandmother, paternal aunt, paternal aunt, and paternal aunt; Hearing loss in her father; Heart disease in her father; Hyperlipidemia in her father; Hypertension in her father. Social History:  reports that she has never smoked. She has quit using smokeless tobacco. She reports that she does not drink alcohol or use drugs.     Maternal Diabetes: No Genetic Screening: Normal Maternal Ultrasounds/Referrals: Normal Fetal Ultrasounds or other Referrals:  None Maternal Substance Abuse:  No Significant Maternal Medications:  None Significant Maternal Lab Results:  None Other Comments:  None  ROS History Dilation: 3 Effacement (%): 70 Station: -2 Exam by:: CJS, RN Blood pressure 115/79, pulse 64, temperature 98.8 F (37.1 C), temperature source Oral, resp. rate 18, height 5\' 3"  (1.6 m), weight 195 lb (88.5 kg), last menstrual period 05/09/2016, SpO2 100 %. Exam Physical Exam  Lungs CTA  cv RRR  adb gravid soft  Cx AROM clear 8 /c /+1  VTX  EFM : reassurring , no decels  Prenatal labs: ABO, Rh: --/--/A POS (03/12 7672) Antibody: NEG (03/12 0829) Rubella: Immune (08/30 0000) RPR: Non Reactive (03/18 2130)  HBsAg: Negative (08/30 0000)  HIV: Non-reactive (08/30 0000)  GBS: Positive, Negative (02/16 0000) GBS negative   Assessment/Plan: Active labor    [name] 02/21/2017, 5:34 AM

## 2017-02-22 ENCOUNTER — Encounter: Payer: Self-pay | Admitting: Obstetrics & Gynecology

## 2017-02-22 DIAGNOSIS — D62 Acute posthemorrhagic anemia: Secondary | ICD-10-CM | POA: Diagnosis not present

## 2017-02-22 LAB — CBC
HCT: 26.9 % — ABNORMAL LOW (ref 35.0–47.0)
Hemoglobin: 9.4 g/dL — ABNORMAL LOW (ref 12.0–16.0)
MCH: 32 pg (ref 26.0–34.0)
MCHC: 34.9 g/dL (ref 32.0–36.0)
MCV: 91.6 fL (ref 80.0–100.0)
PLATELETS: 169 10*3/uL (ref 150–440)
RBC: 2.94 MIL/uL — AB (ref 3.80–5.20)
RDW: 13.6 % (ref 11.5–14.5)
WBC: 12 10*3/uL — ABNORMAL HIGH (ref 3.6–11.0)

## 2017-02-22 MED ORDER — IBUPROFEN 600 MG PO TABS
600.0000 mg | ORAL_TABLET | Freq: Four times a day (QID) | ORAL | Status: DC
  Administered 2017-02-23: 600 mg via ORAL
  Filled 2017-02-22: qty 1

## 2017-02-22 NOTE — Progress Notes (Signed)
  Subjective:   Doing well.  No complaints.  Has not yet voided (foley in) or ambulated yet.  Tolerating regular PO diet, tolerating pain with PO meds. Denies: CP SOB F/C, N/V, calf pain    Objective:  Blood pressure 109/67, pulse 79, temperature 98.6 F (37 C), temperature source Oral, resp. rate 20, height 5\' 3"  (1.6 m), weight 88.5 kg (195 lb), last menstrual period 05/09/2016, SpO2 97 %, currently breastfeeding.  General: NAD Pulmonary: no increased work of breathing Abdomen: non-distended, non-tender, fundus firm at level of umbilicus Incision:c/d/I covered in glue Extremities: no edema, no erythema, no tenderness  Results for orders placed or performed during the hospital encounter of 02/19/17 (from the past 24 hour(s))  Hemoglobin and hematocrit, blood     Status: Abnormal   Collection Time: 02/21/17  9:22 PM  Result Value Ref Range   Hemoglobin 11.4 (L) 12.0 - 16.0 g/dL   HCT 33.4 (L) 35.0 - 47.0 %  CBC     Status: Abnormal   Collection Time: 02/22/17  4:45 AM  Result Value Ref Range   WBC 12.0 (H) 3.6 - 11.0 K/uL   RBC 2.94 (L) 3.80 - 5.20 MIL/uL   Hemoglobin 9.4 (L) 12.0 - 16.0 g/dL   HCT 26.9 (L) 35.0 - 47.0 %   MCV 91.6 80.0 - 100.0 fL   MCH 32.0 26.0 - 34.0 pg   MCHC 34.9 32.0 - 36.0 g/dL   RDW 13.6 11.5 - 14.5 %   Platelets 169 150 - 440 K/uL    Intake/Output Summary (Last 24 hours) at 02/22/17 0750 Last data filed at 02/22/17 0455  Gross per 24 hour  Intake          8668.37 ml  Output             2750 ml  Net          5918.37 ml      Assessment:   41 y.o. G1P1001 postoperativeday # 1 from LTCS for failed labor induction/cephalopelvic disproportion   Plan:  1) Acute blood loss anemia - hemodynamically stable and asymptomatic - po ferrous sulfate  2) post op: continue routine cares - d/c foley now and get OOB. Encourage ambulation.  Advance diet as tolerated  3) Disposition: continue inpatient admission  ----- Larey Days, MD Attending  Obstetrician and Gynecologist [facility], Department of [facility]

## 2017-02-23 LAB — SURGICAL PATHOLOGY

## 2017-02-23 MED ORDER — ACETAMINOPHEN 500 MG PO TABS: 1000.0000 mg | ORAL_TABLET | Freq: Three times a day (TID) | ORAL | 0 refills | Status: AC

## 2017-02-23 MED ORDER — BREAST PUMP MISC: 0 refills | Status: DC

## 2017-02-23 MED ORDER — IBUPROFEN 800 MG PO TABS: 800.0000 mg | ORAL_TABLET | Freq: Three times a day (TID) | ORAL | 1 refills | Status: AC

## 2017-02-23 MED ORDER — OXYCODONE HCL 5 MG PO CAPS: 5.0000 mg | ORAL_CAPSULE | Freq: Four times a day (QID) | ORAL | 0 refills | Status: DC | PRN

## 2017-02-23 MED ORDER — IBUPROFEN 600 MG PO TABS
600.0000 mg | ORAL_TABLET | Freq: Four times a day (QID) | ORAL | Status: DC
  Administered 2017-02-23: 600 mg via ORAL
  Filled 2017-02-23: qty 1

## 2017-02-23 NOTE — Progress Notes (Signed)
Reviewed all patients discharge instructions and handouts regarding postpartum bleeding, incision care, no intercourse for 6 weeks, signs and symptoms of mastitis and postpartum bleu's. Reviewed discharge instructions for newborn regarding proper cord care, how and when to bathe the newborn, nail care, proper way to take the baby's temperature, along with safe sleep. All questions have been answered at this time. Patient discharged via wheelchair with axillary.

## 2018-12-26 ENCOUNTER — Other Ambulatory Visit: Payer: Self-pay | Admitting: Obstetrics & Gynecology

## 2018-12-26 DIAGNOSIS — Z1231 Encounter for screening mammogram for malignant neoplasm of breast: Secondary | ICD-10-CM

## 2019-01-01 ENCOUNTER — Ambulatory Visit
Admission: RE | Admit: 2019-01-01 | Discharge: 2019-01-01 | Disposition: A | Discharge status: Home / Self Care | Payer: BLUE CROSS/BLUE SHIELD | Source: Ambulatory Visit | Attending: Obstetrics & Gynecology | Admitting: Obstetrics & Gynecology

## 2019-01-01 ENCOUNTER — Encounter (HOSPITAL_COMMUNITY): Payer: Self-pay

## 2019-01-01 DIAGNOSIS — Z1231 Encounter for screening mammogram for malignant neoplasm of breast: Secondary | ICD-10-CM | POA: Diagnosis present

## 2019-07-06 ENCOUNTER — Other Ambulatory Visit: Payer: Self-pay

## 2019-07-06 DIAGNOSIS — Z20822 Contact with and (suspected) exposure to covid-19: Secondary | ICD-10-CM

## 2019-07-07 LAB — NOVEL CORONAVIRUS, NAA: SARS-CoV-2, NAA: NOT DETECTED

## 2019-10-30 IMAGING — MG DIGITAL SCREENING BILATERAL MAMMOGRAM WITH TOMO AND CAD
8 series · 9 of 24 positions shown · non-contrast
Comparison: None.

CLINICAL DATA: Screening.

EXAM:
DIGITAL SCREENING BILATERAL MAMMOGRAM WITH TOMO AND CAD

[L MLO synth-2D]
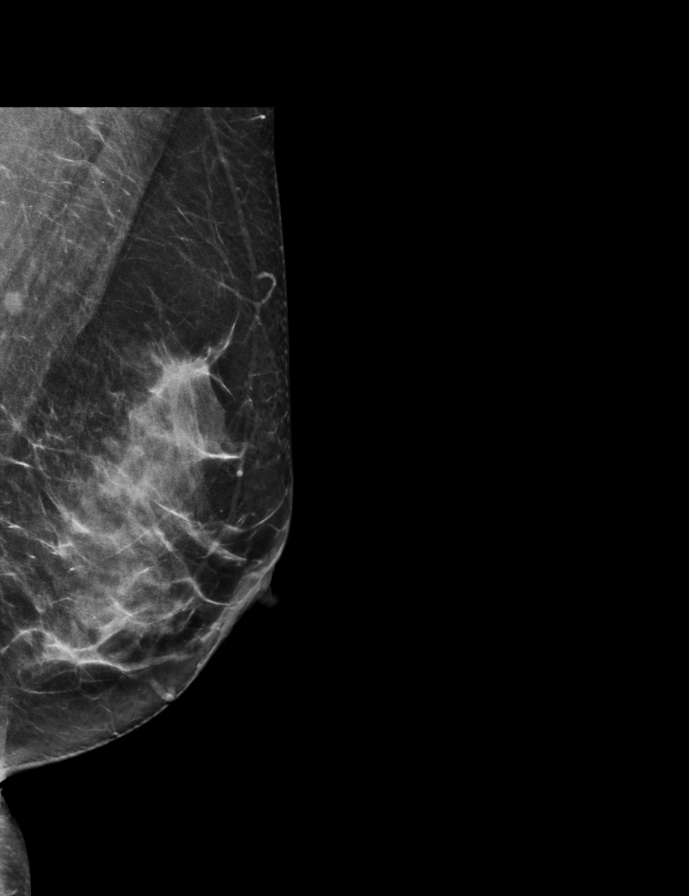

[R MLO synth-2D]
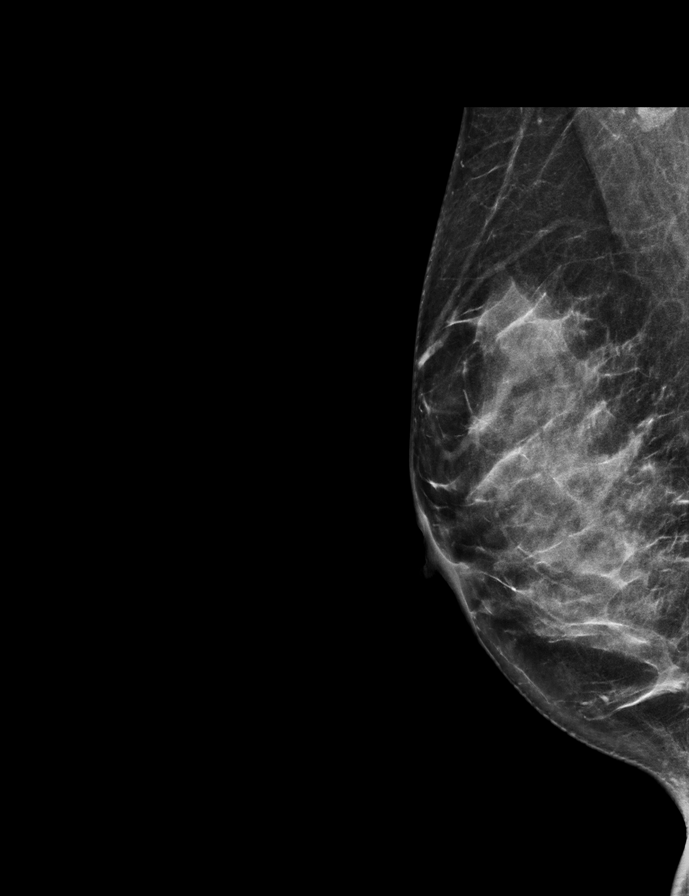

[R CC synth-2D]
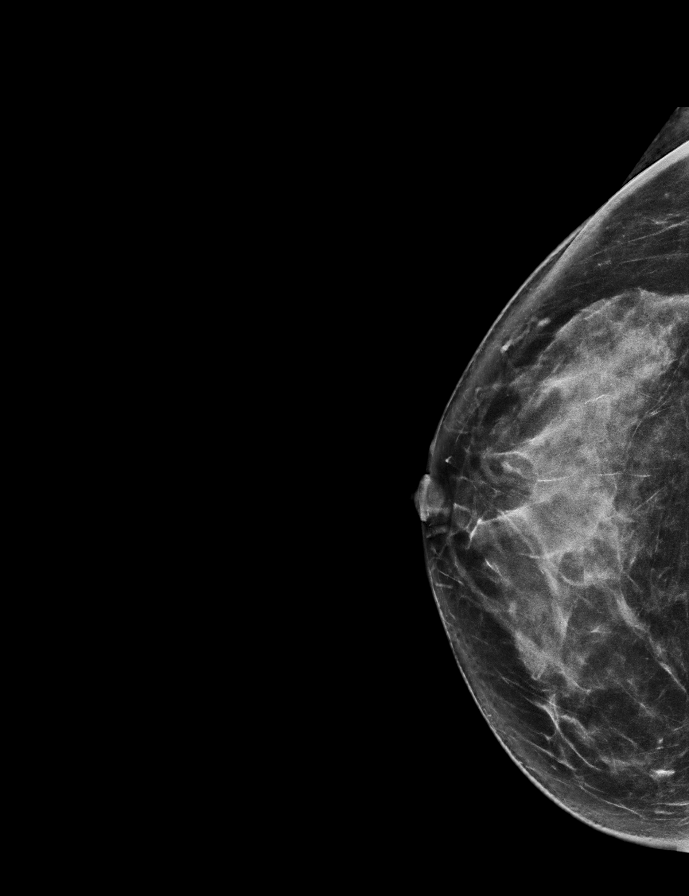

[L CC synth-2D]
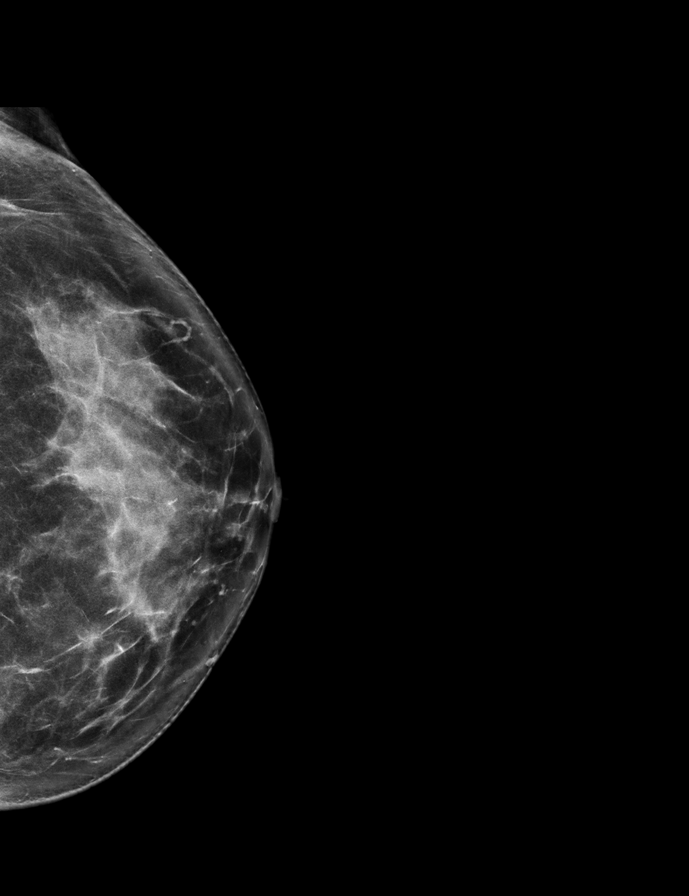

[L CC tomo · 2 of 71 frames shown]
[frame 23/71]
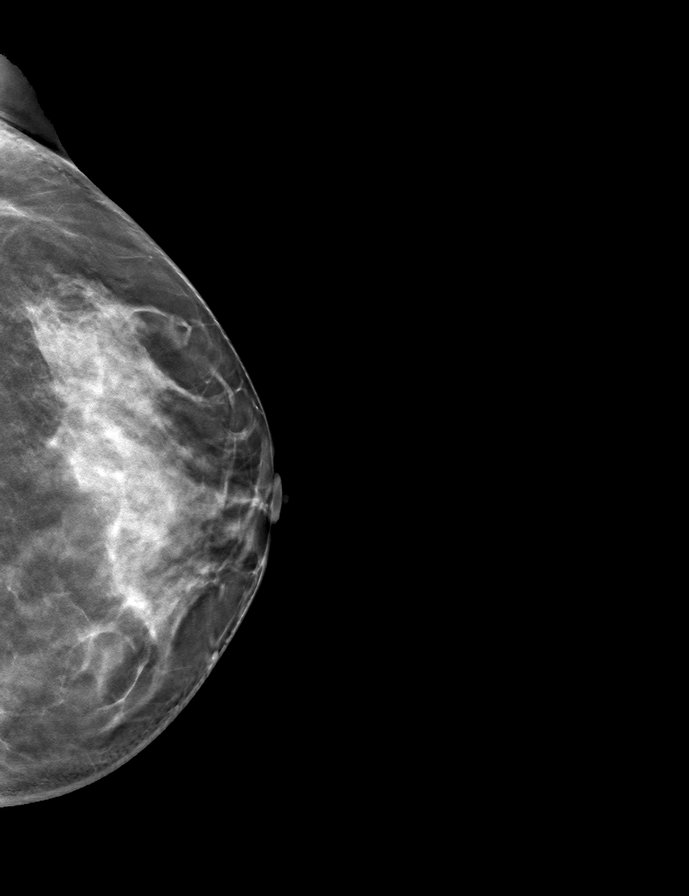
[frame 36/71]
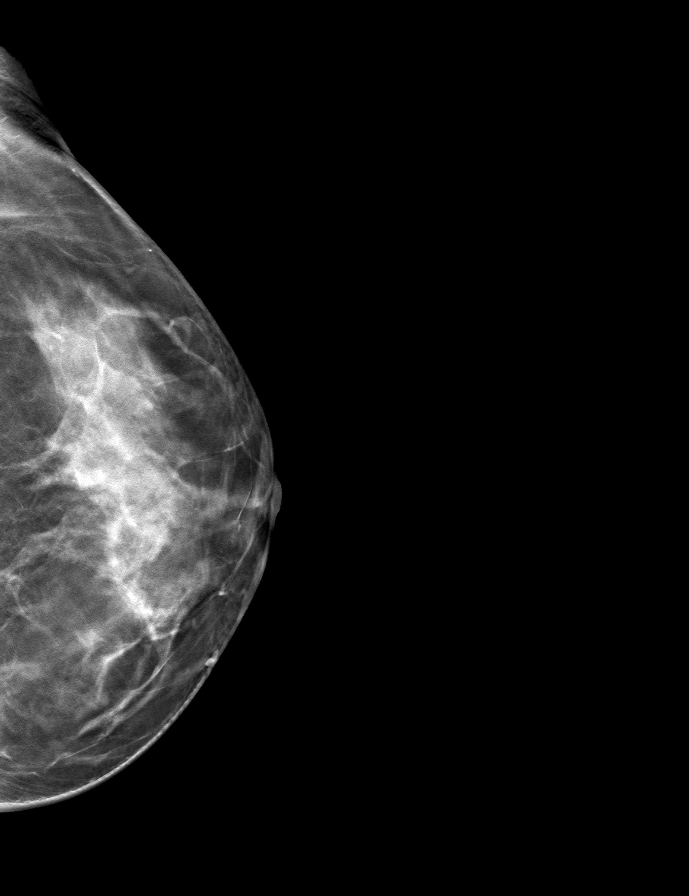

[L MLO tomo · tomo slice 33/64.0]
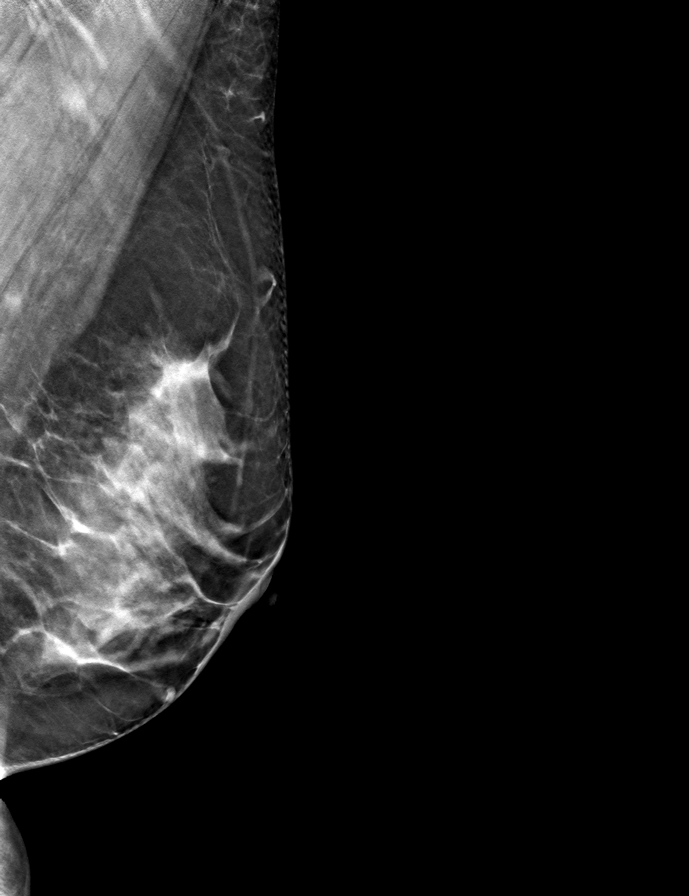

[R CC tomo · tomo slice 35/68.0]
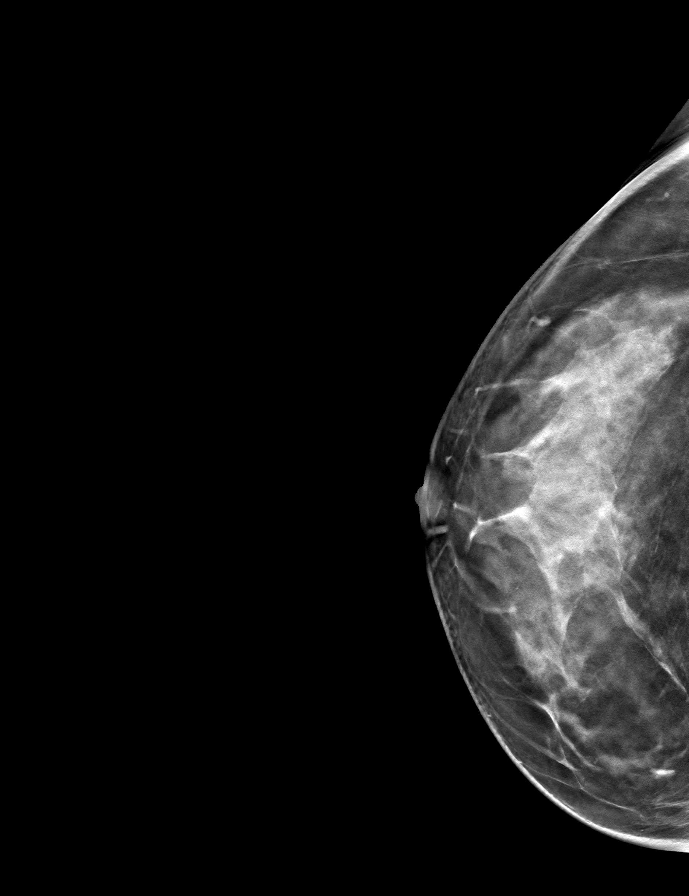

[R MLO tomo · tomo slice 31/61.0]
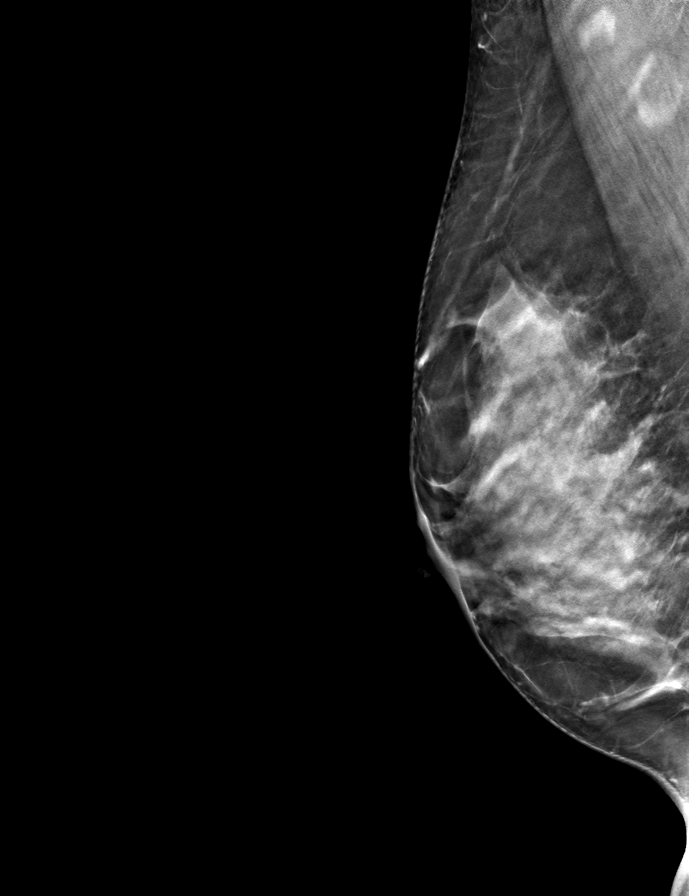

[9 of 24 positions shown; findings below may reference images not displayed]

ACR Breast Density Category c: The breast tissue is heterogeneously
dense, which may obscure small masses
FINDINGS: There are no findings suspicious for malignancy. Images were
processed with CAD.
IMPRESSION: No mammographic evidence of malignancy. A result letter of this
screening mammogram will be mailed directly to the patient.

RECOMMENDATION:
Screening mammogram in one year. (Code:[id number])

BI-RADS CATEGORY  1: Negative.

## 2019-12-11 DIAGNOSIS — R519 Headache, unspecified: Secondary | ICD-10-CM | POA: Diagnosis not present

## 2019-12-11 DIAGNOSIS — Z20822 Contact with and (suspected) exposure to covid-19: Secondary | ICD-10-CM | POA: Diagnosis not present

## 2020-01-21 DIAGNOSIS — H6123 Impacted cerumen, bilateral: Secondary | ICD-10-CM | POA: Diagnosis not present

## 2020-01-28 DIAGNOSIS — H6983 Other specified disorders of Eustachian tube, bilateral: Secondary | ICD-10-CM | POA: Diagnosis not present

## 2020-02-10 ENCOUNTER — Ambulatory Visit: Payer: BC Managed Care – PPO | Attending: Internal Medicine

## 2020-02-10 DIAGNOSIS — Z23 Encounter for immunization: Secondary | ICD-10-CM | POA: Insufficient documentation

## 2020-02-10 NOTE — Progress Notes (Signed)
   Covid-19 Vaccination Clinic  Name:  Carolyn Lopez    MRN: [id number] DOB: 1976/07/28  02/10/2020  [name] was observed post Covid-19 immunization for 15 minutes without incident. She was provided with Vaccine Information Sheet and instruction to access the V-Safe system.   [name] was instructed to call 911 with any severe reactions post vaccine: Marland Kitchen Difficulty breathing  . Swelling of face and throat  . A fast heartbeat  . A bad rash all over body  . Dizziness and weakness   Immunizations Administered    Name Date Dose VIS Date Route   Pfizer COVID-19 Vaccine 02/10/2020  2:46 PM 0.3 mL 11/15/2019 Intramuscular   Manufacturer: [place]   Lot: [id number]   [place]: [id number]

## 2020-03-11 ENCOUNTER — Ambulatory Visit: Payer: BC Managed Care – PPO | Attending: Internal Medicine

## 2020-03-11 DIAGNOSIS — Z23 Encounter for immunization: Secondary | ICD-10-CM

## 2020-03-11 NOTE — Progress Notes (Signed)
   Covid-19 Vaccination Clinic  Name:  Carolyn Lopez    MRN: [id number] DOB: 1976/03/31  03/11/2020  [name] was observed post Covid-19 immunization for 15 minutes without incident. She was provided with Vaccine Information Sheet and instruction to access the V-Safe system.   [name] was instructed to call 911 with any severe reactions post vaccine: Marland Kitchen Difficulty breathing  . Swelling of face and throat  . A fast heartbeat  . A bad rash all over body  . Dizziness and weakness   Immunizations Administered    Name Date Dose VIS Date Route   Pfizer COVID-19 Vaccine 03/11/2020 12:41 PM 0.3 mL 11/15/2019 Intramuscular   Manufacturer: [place]   Lot: [id number]   [place]: [id number]

## 2020-04-24 DIAGNOSIS — Z1231 Encounter for screening mammogram for malignant neoplasm of breast: Secondary | ICD-10-CM | POA: Diagnosis not present

## 2020-04-24 DIAGNOSIS — Z1239 Encounter for other screening for malignant neoplasm of breast: Secondary | ICD-10-CM | POA: Diagnosis not present

## 2020-07-13 DIAGNOSIS — F321 Major depressive disorder, single episode, moderate: Secondary | ICD-10-CM | POA: Diagnosis not present

## 2020-07-21 DIAGNOSIS — F321 Major depressive disorder, single episode, moderate: Secondary | ICD-10-CM | POA: Diagnosis not present

## 2020-07-27 DIAGNOSIS — F321 Major depressive disorder, single episode, moderate: Secondary | ICD-10-CM | POA: Diagnosis not present

## 2020-07-28 DIAGNOSIS — F321 Major depressive disorder, single episode, moderate: Secondary | ICD-10-CM | POA: Diagnosis not present

## 2020-08-04 DIAGNOSIS — F321 Major depressive disorder, single episode, moderate: Secondary | ICD-10-CM | POA: Diagnosis not present

## 2020-08-20 DIAGNOSIS — F321 Major depressive disorder, single episode, moderate: Secondary | ICD-10-CM | POA: Diagnosis not present

## 2020-08-27 DIAGNOSIS — F321 Major depressive disorder, single episode, moderate: Secondary | ICD-10-CM | POA: Diagnosis not present

## 2020-09-03 DIAGNOSIS — F321 Major depressive disorder, single episode, moderate: Secondary | ICD-10-CM | POA: Diagnosis not present

## 2020-09-10 DIAGNOSIS — F321 Major depressive disorder, single episode, moderate: Secondary | ICD-10-CM | POA: Diagnosis not present

## 2020-09-11 DIAGNOSIS — Z131 Encounter for screening for diabetes mellitus: Secondary | ICD-10-CM | POA: Diagnosis not present

## 2020-09-11 DIAGNOSIS — Z0000 Encounter for general adult medical examination without abnormal findings: Secondary | ICD-10-CM | POA: Diagnosis not present

## 2020-09-11 DIAGNOSIS — F411 Generalized anxiety disorder: Secondary | ICD-10-CM | POA: Diagnosis not present

## 2020-09-11 DIAGNOSIS — Z13220 Encounter for screening for lipoid disorders: Secondary | ICD-10-CM | POA: Diagnosis not present

## 2020-09-11 DIAGNOSIS — Z1331 Encounter for screening for depression: Secondary | ICD-10-CM | POA: Diagnosis not present

## 2020-09-17 DIAGNOSIS — F321 Major depressive disorder, single episode, moderate: Secondary | ICD-10-CM | POA: Diagnosis not present

## 2020-09-24 DIAGNOSIS — F321 Major depressive disorder, single episode, moderate: Secondary | ICD-10-CM | POA: Diagnosis not present

## 2020-10-01 DIAGNOSIS — F321 Major depressive disorder, single episode, moderate: Secondary | ICD-10-CM | POA: Diagnosis not present

## 2020-10-08 DIAGNOSIS — F321 Major depressive disorder, single episode, moderate: Secondary | ICD-10-CM | POA: Diagnosis not present

## 2020-10-15 DIAGNOSIS — F321 Major depressive disorder, single episode, moderate: Secondary | ICD-10-CM | POA: Diagnosis not present

## 2020-11-03 DIAGNOSIS — H1032 Unspecified acute conjunctivitis, left eye: Secondary | ICD-10-CM | POA: Diagnosis not present

## 2020-11-03 DIAGNOSIS — J0100 Acute maxillary sinusitis, unspecified: Secondary | ICD-10-CM | POA: Diagnosis not present

## 2020-11-05 DIAGNOSIS — F321 Major depressive disorder, single episode, moderate: Secondary | ICD-10-CM | POA: Diagnosis not present

## 2020-11-12 DIAGNOSIS — F321 Major depressive disorder, single episode, moderate: Secondary | ICD-10-CM | POA: Diagnosis not present

## 2020-11-17 DIAGNOSIS — J029 Acute pharyngitis, unspecified: Secondary | ICD-10-CM | POA: Diagnosis not present

## 2020-11-17 DIAGNOSIS — J019 Acute sinusitis, unspecified: Secondary | ICD-10-CM | POA: Diagnosis not present

## 2020-11-17 DIAGNOSIS — H6502 Acute serous otitis media, left ear: Secondary | ICD-10-CM | POA: Diagnosis not present

## 2020-11-26 DIAGNOSIS — F321 Major depressive disorder, single episode, moderate: Secondary | ICD-10-CM | POA: Diagnosis not present

## 2020-12-03 DIAGNOSIS — F321 Major depressive disorder, single episode, moderate: Secondary | ICD-10-CM | POA: Diagnosis not present

## 2020-12-10 DIAGNOSIS — F321 Major depressive disorder, single episode, moderate: Secondary | ICD-10-CM | POA: Diagnosis not present

## 2020-12-24 DIAGNOSIS — F321 Major depressive disorder, single episode, moderate: Secondary | ICD-10-CM | POA: Diagnosis not present

## 2020-12-31 DIAGNOSIS — F321 Major depressive disorder, single episode, moderate: Secondary | ICD-10-CM | POA: Diagnosis not present

## 2021-01-07 DIAGNOSIS — F321 Major depressive disorder, single episode, moderate: Secondary | ICD-10-CM | POA: Diagnosis not present

## 2021-01-14 DIAGNOSIS — F321 Major depressive disorder, single episode, moderate: Secondary | ICD-10-CM | POA: Diagnosis not present

## 2021-01-21 DIAGNOSIS — F321 Major depressive disorder, single episode, moderate: Secondary | ICD-10-CM | POA: Diagnosis not present

## 2021-01-28 DIAGNOSIS — F321 Major depressive disorder, single episode, moderate: Secondary | ICD-10-CM | POA: Diagnosis not present

## 2021-02-04 DIAGNOSIS — F321 Major depressive disorder, single episode, moderate: Secondary | ICD-10-CM | POA: Diagnosis not present

## 2021-02-11 DIAGNOSIS — F321 Major depressive disorder, single episode, moderate: Secondary | ICD-10-CM | POA: Diagnosis not present

## 2021-02-18 DIAGNOSIS — F321 Major depressive disorder, single episode, moderate: Secondary | ICD-10-CM | POA: Diagnosis not present

## 2021-02-25 DIAGNOSIS — F321 Major depressive disorder, single episode, moderate: Secondary | ICD-10-CM | POA: Diagnosis not present

## 2021-03-11 DIAGNOSIS — F321 Major depressive disorder, single episode, moderate: Secondary | ICD-10-CM | POA: Diagnosis not present

## 2021-03-18 DIAGNOSIS — F321 Major depressive disorder, single episode, moderate: Secondary | ICD-10-CM | POA: Diagnosis not present

## 2021-03-25 DIAGNOSIS — F321 Major depressive disorder, single episode, moderate: Secondary | ICD-10-CM | POA: Diagnosis not present

## 2021-04-01 DIAGNOSIS — F321 Major depressive disorder, single episode, moderate: Secondary | ICD-10-CM | POA: Diagnosis not present

## 2021-04-07 DIAGNOSIS — H8110 Benign paroxysmal vertigo, unspecified ear: Secondary | ICD-10-CM | POA: Diagnosis not present

## 2021-04-15 DIAGNOSIS — F321 Major depressive disorder, single episode, moderate: Secondary | ICD-10-CM | POA: Diagnosis not present

## 2021-05-12 DIAGNOSIS — Z1231 Encounter for screening mammogram for malignant neoplasm of breast: Secondary | ICD-10-CM | POA: Diagnosis not present

## 2021-05-12 DIAGNOSIS — Z1239 Encounter for other screening for malignant neoplasm of breast: Secondary | ICD-10-CM | POA: Diagnosis not present

## 2021-05-13 DIAGNOSIS — F321 Major depressive disorder, single episode, moderate: Secondary | ICD-10-CM | POA: Diagnosis not present

## 2021-05-20 DIAGNOSIS — F321 Major depressive disorder, single episode, moderate: Secondary | ICD-10-CM | POA: Diagnosis not present

## 2021-05-21 DIAGNOSIS — N6311 Unspecified lump in the right breast, upper outer quadrant: Secondary | ICD-10-CM | POA: Diagnosis not present

## 2021-05-21 DIAGNOSIS — N6001 Solitary cyst of right breast: Secondary | ICD-10-CM | POA: Diagnosis not present

## 2021-05-21 DIAGNOSIS — N6489 Other specified disorders of breast: Secondary | ICD-10-CM | POA: Diagnosis not present

## 2021-05-21 DIAGNOSIS — R922 Inconclusive mammogram: Secondary | ICD-10-CM | POA: Diagnosis not present

## 2021-05-27 DIAGNOSIS — F321 Major depressive disorder, single episode, moderate: Secondary | ICD-10-CM | POA: Diagnosis not present

## 2021-05-28 DIAGNOSIS — D485 Neoplasm of uncertain behavior of skin: Secondary | ICD-10-CM | POA: Diagnosis not present

## 2021-05-28 DIAGNOSIS — D2261 Melanocytic nevi of right upper limb, including shoulder: Secondary | ICD-10-CM | POA: Diagnosis not present

## 2021-05-28 DIAGNOSIS — F411 Generalized anxiety disorder: Secondary | ICD-10-CM | POA: Diagnosis not present

## 2021-05-28 DIAGNOSIS — D2272 Melanocytic nevi of left lower limb, including hip: Secondary | ICD-10-CM | POA: Diagnosis not present

## 2021-05-28 DIAGNOSIS — D2262 Melanocytic nevi of left upper limb, including shoulder: Secondary | ICD-10-CM | POA: Diagnosis not present

## 2021-05-28 DIAGNOSIS — D225 Melanocytic nevi of trunk: Secondary | ICD-10-CM | POA: Diagnosis not present

## 2021-05-28 DIAGNOSIS — R41840 Attention and concentration deficit: Secondary | ICD-10-CM | POA: Diagnosis not present

## 2021-06-17 DIAGNOSIS — F321 Major depressive disorder, single episode, moderate: Secondary | ICD-10-CM | POA: Diagnosis not present

## 2021-06-18 DIAGNOSIS — D2261 Melanocytic nevi of right upper limb, including shoulder: Secondary | ICD-10-CM | POA: Diagnosis not present

## 2021-06-24 DIAGNOSIS — F321 Major depressive disorder, single episode, moderate: Secondary | ICD-10-CM | POA: Diagnosis not present

## 2021-07-01 DIAGNOSIS — F321 Major depressive disorder, single episode, moderate: Secondary | ICD-10-CM | POA: Diagnosis not present

## 2021-07-15 DIAGNOSIS — F321 Major depressive disorder, single episode, moderate: Secondary | ICD-10-CM | POA: Diagnosis not present

## 2021-07-22 DIAGNOSIS — F321 Major depressive disorder, single episode, moderate: Secondary | ICD-10-CM | POA: Diagnosis not present

## 2021-08-12 DIAGNOSIS — F321 Major depressive disorder, single episode, moderate: Secondary | ICD-10-CM | POA: Diagnosis not present

## 2021-08-19 DIAGNOSIS — F321 Major depressive disorder, single episode, moderate: Secondary | ICD-10-CM | POA: Diagnosis not present

## 2021-10-07 DIAGNOSIS — F321 Major depressive disorder, single episode, moderate: Secondary | ICD-10-CM | POA: Diagnosis not present

## 2021-10-21 DIAGNOSIS — F321 Major depressive disorder, single episode, moderate: Secondary | ICD-10-CM | POA: Diagnosis not present

## 2021-10-27 ENCOUNTER — Telehealth: Payer: BC Managed Care – PPO | Admitting: Family

## 2021-10-27 DIAGNOSIS — U071 COVID-19: Secondary | ICD-10-CM

## 2021-10-27 MED ORDER — BENZONATATE 100 MG PO CAPS: 100.0000 mg | ORAL_CAPSULE | Freq: Three times a day (TID) | ORAL | 0 refills | Status: AC | PRN

## 2021-10-27 MED ORDER — ALBUTEROL SULFATE HFA 108 (90 BASE) MCG/ACT IN AERS: 2.0000 | INHALATION_SPRAY | Freq: Four times a day (QID) | RESPIRATORY_TRACT | 0 refills | Status: AC | PRN

## 2021-10-27 MED ORDER — MOLNUPIRAVIR EUA 200MG CAPSULE: 4.0000 | ORAL_CAPSULE | Freq: Two times a day (BID) | ORAL | 0 refills | Status: AC

## 2021-10-27 NOTE — Patient Instructions (Signed)
10 Things You Can Do to Manage Your COVID-19 Symptoms at Home ?If you have possible or confirmed COVID-19 ?Stay home except to get medical care. ?Monitor your symptoms carefully. If your symptoms get worse, call your healthcare provider immediately. ?Get rest and stay hydrated. ?If you have a medical appointment, call the healthcare provider ahead of time and tell them that you have or may have COVID-19. ?For medical emergencies, call 911 and notify the dispatch personnel that you have or may have COVID-19. ?Cover your cough and sneezes with a tissue or use the inside of your elbow. ?Wash your hands often with soap and water for at least 20 seconds or clean your hands with an alcohol-based hand sanitizer that contains at least 60% alcohol. ?As much as possible, stay in a specific room and away from other people in your home. Also, you should use a separate bathroom, if available. If you need to be around other people in or outside of the home, wear a mask. ?Avoid sharing personal items with other people in your household, like dishes, towels, and bedding. ?Clean all surfaces that are touched often, like counters, tabletops, and doorknobs. Use household cleaning sprays or wipes according to the label instructions. ?cdc.gov/coronavirus ?06/19/2020 ?This information is not intended to replace advice given to you by your health care provider. Make sure you discuss any questions you have with your health care provider. ?Document Revised: 08/13/2021 Document Reviewed: 08/13/2021 ?Elsevier Patient Education ? 2022 Elsevier Inc. ? ?

## 2021-10-27 NOTE — Progress Notes (Signed)
Virtual Visit Consent   Carolyn Lopez, you are scheduled for a virtual visit with a [facility] provider today.     Just as with appointments in the office, your consent must be obtained to participate.  Your consent will be active for this visit and any virtual visit you may have with one of our providers in the next 365 days.     If you have a MyChart account, a copy of this consent can be sent to you electronically.  All virtual visits are billed to your insurance company just like a traditional visit in the office.    As this is a virtual visit, video technology does not allow for your provider to perform a traditional examination.  This may limit your provider's ability to fully assess your condition.  If your provider identifies any concerns that need to be evaluated in person or the need to arrange testing (such as labs, EKG, etc.), we will make arrangements to do so.     Although advances in technology are sophisticated, we cannot ensure that it will always work on either your end or our end.  If the connection with a video visit is poor, the visit may have to be switched to a telephone visit.  With either a video or telephone visit, we are not always able to ensure that we have a secure connection.     I need to obtain your verbal consent now.   Are you willing to proceed with your visit today?    Carolyn Lopez has provided verbal consent on 10/27/2021 for a virtual visit (video or telephone).   Carolyn Dun, FNP   Date: 10/27/2021 8:05 AM   Virtual Visit via Video Note   I, Carolyn Lopez, connected with  Carolyn Lopez  ([id number], June 30, 1976) on 10/27/21 at  8:00 AM EST by a video-enabled telemedicine application and verified that I am speaking with the correct person using two identifiers.  Location: Patient: Virtual Visit Location Patient: Home Provider: Virtual Visit Location Provider: Home Office   I discussed the limitations of evaluation and management by  telemedicine and the availability of in person appointments. The patient expressed understanding and agreed to proceed.    History of Present Illness: Carolyn Lopez is a 45 y.o. who identifies as a female who was assigned female at birth, and is being seen today for cough. She reports she tested positive for COVID on 10/25/21.   HPI: Cough This is a new problem. The current episode started in the past 7 days. The problem has been gradually worsening. The cough is Non-productive. Associated symptoms include chills, a fever (101.5), headaches, myalgias, nasal congestion, postnasal drip, rhinorrhea, a sore throat, shortness of breath and wheezing. Pertinent negatives include no ear congestion or ear pain. She has tried rest and OTC cough suppressant for the symptoms. The treatment provided mild relief.   Problems:  Patient Active Problem List   Diagnosis Date Noted   Acute blood loss as cause of postoperative anemia 02/22/2017   Pregnancy 02/20/2017   Indication for care in labor or delivery 02/19/2017   Labor and delivery, indication for care 02/13/2017   Advanced maternal age, 1st pregnancy    Family history of Down syndrome     Allergies: No Known Allergies Medications:  Current Outpatient Medications:    albuterol (VENTOLIN HFA) 108 (90 Base) MCG/ACT inhaler, Inhale 2 puffs into the lungs every 6 (six) hours as needed for wheezing or shortness of breath., Disp: 8 g, Rfl: 0  benzonatate (TESSALON PERLES) 100 MG capsule, Take 1 capsule (100 mg total) by mouth 3 (three) times daily as needed., Disp: 20 capsule, Rfl: 0   molnupiravir EUA (LAGEVRIO) 200 mg CAPS capsule, Take 4 capsules (800 mg total) by mouth 2 (two) times daily for 5 days., Disp: 40 capsule, Rfl: 0  Observations/Objective: Patient is well-developed, well-nourished in no acute distress.  Resting comfortably  at home.  Head is normocephalic, atraumatic.  No labored breathing.  Speech is clear and coherent with logical  content.  Patient is alert and oriented at baseline.  Nasal congestion   Assessment and Plan: 1. COVID-19 virus detected - molnupiravir EUA (LAGEVRIO) 200 mg CAPS capsule; Take 4 capsules (800 mg total) by mouth 2 (two) times daily for 5 days.  Dispense: 40 capsule; Refill: 0 - albuterol (VENTOLIN HFA) 108 (90 Base) MCG/ACT inhaler; Inhale 2 puffs into the lungs every 6 (six) hours as needed for wheezing or shortness of breath.  Dispense: 8 g; Refill: 0 - benzonatate (TESSALON PERLES) 100 MG capsule; Take 1 capsule (100 mg total) by mouth 3 (three) times daily as needed.  Dispense: 20 capsule; Refill: 0 - Take meds as prescribed - Use a cool mist humidifier  -Use saline nose sprays frequently -Force fluids -For any cough or congestion  Use plain Mucinex- regular strength or max strength is fine -For fever or aces or pains- take tylenol or ibuprofen. -Throat lozenges if help  Follow Up Instructions: I discussed the assessment and treatment plan with the patient. The patient was provided an opportunity to ask questions and all were answered. The patient agreed with the plan and demonstrated an understanding of the instructions.  A copy of instructions were sent to the patient via MyChart unless otherwise noted below.     The patient was advised to call back or seek an in-person evaluation if the symptoms worsen or if the condition fails to improve as anticipated.  Time:  I spent 10 minutes with the patient via telehealth technology discussing the above problems/concerns.    Carolyn Dun, FNP

## 2021-11-01 ENCOUNTER — Telehealth: Payer: BC Managed Care – PPO | Admitting: Emergency Medicine

## 2021-11-01 DIAGNOSIS — J019 Acute sinusitis, unspecified: Secondary | ICD-10-CM

## 2021-11-01 MED ORDER — AMOXICILLIN-POT CLAVULANATE 875-125 MG PO TABS: 1.0000 | ORAL_TABLET | Freq: Two times a day (BID) | ORAL | 0 refills | Status: AC

## 2021-11-01 NOTE — Progress Notes (Signed)
Virtual Visit Consent   Carolyn Lopez, you are scheduled for a virtual visit with a [facility] provider today.     Just as with appointments in the office, your consent must be obtained to participate.  Your consent will be active for this visit and any virtual visit you may have with one of our providers in the next 365 days.     If you have a MyChart account, a copy of this consent can be sent to you electronically.  All virtual visits are billed to your insurance company just like a traditional visit in the office.    As this is a virtual visit, video technology does not allow for your provider to perform a traditional examination.  This may limit your provider's ability to fully assess your condition.  If your provider identifies any concerns that need to be evaluated in person or the need to arrange testing (such as labs, EKG, etc.), we will make arrangements to do so.     Although advances in technology are sophisticated, we cannot ensure that it will always work on either your end or our end.  If the connection with a video visit is poor, the visit may have to be switched to a telephone visit.  With either a video or telephone visit, we are not always able to ensure that we have a secure connection.     I need to obtain your verbal consent now.   Are you willing to proceed with your visit today?    Carolyn Lopez has provided verbal consent on 11/01/2021 for a virtual visit video.   Montine Circle, PA-C   Date: 11/01/2021 10:43 AM   Virtual Visit via Video Note   I, Montine Circle, connected with  Carolyn Lopez  ([id number], 12/03/1976) on 11/01/21 at 10:45 AM EST by a video-enabled telemedicine application and verified that I am speaking with the correct person using two identifiers.  Location: Patient: Virtual Visit Location Patient: Home Provider: Virtual Visit Location Provider: Home Office   I discussed the limitations of evaluation and management by telemedicine and  the availability of in person appointments. The patient expressed understanding and agreed to proceed.    History of Present Illness: Carolyn Lopez is a 45 y.o. who identifies as a female who was assigned female at birth, and is being seen today for sinus congestion.  Has been having pain and pressure in the sinuses.  States that it is bright yellow.  States that the pain worsened 3 days ago, but has been going for about a week.  Color of discharge changed on Saturday.  Denies running any new fevers.  HPI: HPI  Problems:  Patient Active Problem List   Diagnosis Date Noted   Acute blood loss as cause of postoperative anemia 02/22/2017   Pregnancy 02/20/2017   Indication for care in labor or delivery 02/19/2017   Labor and delivery, indication for care 02/13/2017   Advanced maternal age, 1st pregnancy    Family history of Down syndrome     Allergies: No Known Allergies Medications:  Current Outpatient Medications:    albuterol (VENTOLIN HFA) 108 (90 Base) MCG/ACT inhaler, Inhale 2 puffs into the lungs every 6 (six) hours as needed for wheezing or shortness of breath., Disp: 8 g, Rfl: 0   benzonatate (TESSALON PERLES) 100 MG capsule, Take 1 capsule (100 mg total) by mouth 3 (three) times daily as needed., Disp: 20 capsule, Rfl: 0   molnupiravir EUA (LAGEVRIO) 200 mg CAPS capsule, Take  4 capsules (800 mg total) by mouth 2 (two) times daily for 5 days., Disp: 40 capsule, Rfl: 0  Observations/Objective: Patient is well-developed, well-nourished in no acute distress.  Resting comfortably  at home.  Head is normocephalic, atraumatic.  No labored breathing.  Speech is clear and coherent with logical content.  Patient is alert and oriented at baseline.    Assessment and Plan: 1. Acute sinusitis, recurrence not specified, unspecified location -Augmentin (denies pregnancy or breastfeeding)  Follow Up Instructions: I discussed the assessment and treatment plan with the patient. The patient  was provided an opportunity to ask questions and all were answered. The patient agreed with the plan and demonstrated an understanding of the instructions.  A copy of instructions were sent to the patient via MyChart unless otherwise noted below.     The patient was advised to call back or seek an in-person evaluation if the symptoms worsen or if the condition fails to improve as anticipated.  Time:  I spent 6 minutes with the patient via telehealth technology discussing the above problems/concerns.    Montine Circle, PA-C

## 2021-11-05 DIAGNOSIS — F321 Major depressive disorder, single episode, moderate: Secondary | ICD-10-CM | POA: Diagnosis not present

## 2021-11-11 DIAGNOSIS — F321 Major depressive disorder, single episode, moderate: Secondary | ICD-10-CM | POA: Diagnosis not present

## 2021-11-18 ENCOUNTER — Other Ambulatory Visit: Payer: Self-pay | Admitting: Family

## 2021-11-18 DIAGNOSIS — F321 Major depressive disorder, single episode, moderate: Secondary | ICD-10-CM | POA: Diagnosis not present

## 2021-11-18 DIAGNOSIS — U071 COVID-19: Secondary | ICD-10-CM

## 2021-12-10 DIAGNOSIS — D220 Melanocytic nevi of lip: Secondary | ICD-10-CM | POA: Diagnosis not present

## 2021-12-10 DIAGNOSIS — D235 Other benign neoplasm of skin of trunk: Secondary | ICD-10-CM | POA: Diagnosis not present

## 2021-12-10 DIAGNOSIS — D485 Neoplasm of uncertain behavior of skin: Secondary | ICD-10-CM | POA: Diagnosis not present

## 2021-12-23 DIAGNOSIS — F321 Major depressive disorder, single episode, moderate: Secondary | ICD-10-CM | POA: Diagnosis not present

## 2021-12-30 DIAGNOSIS — F321 Major depressive disorder, single episode, moderate: Secondary | ICD-10-CM | POA: Diagnosis not present

## 2022-01-06 DIAGNOSIS — F321 Major depressive disorder, single episode, moderate: Secondary | ICD-10-CM | POA: Diagnosis not present

## 2022-01-13 DIAGNOSIS — F321 Major depressive disorder, single episode, moderate: Secondary | ICD-10-CM | POA: Diagnosis not present

## 2022-01-20 DIAGNOSIS — F321 Major depressive disorder, single episode, moderate: Secondary | ICD-10-CM | POA: Diagnosis not present

## 2022-01-27 DIAGNOSIS — F321 Major depressive disorder, single episode, moderate: Secondary | ICD-10-CM | POA: Diagnosis not present

## 2022-02-03 DIAGNOSIS — F321 Major depressive disorder, single episode, moderate: Secondary | ICD-10-CM | POA: Diagnosis not present

## 2022-02-10 DIAGNOSIS — F321 Major depressive disorder, single episode, moderate: Secondary | ICD-10-CM | POA: Diagnosis not present

## 2022-03-10 DIAGNOSIS — F321 Major depressive disorder, single episode, moderate: Secondary | ICD-10-CM | POA: Diagnosis not present

## 2022-03-24 DIAGNOSIS — F321 Major depressive disorder, single episode, moderate: Secondary | ICD-10-CM | POA: Diagnosis not present

## 2022-04-07 DIAGNOSIS — F321 Major depressive disorder, single episode, moderate: Secondary | ICD-10-CM | POA: Diagnosis not present

## 2022-05-05 DIAGNOSIS — F321 Major depressive disorder, single episode, moderate: Secondary | ICD-10-CM | POA: Diagnosis not present

## 2022-05-19 DIAGNOSIS — F321 Major depressive disorder, single episode, moderate: Secondary | ICD-10-CM | POA: Diagnosis not present

## 2022-06-02 DIAGNOSIS — L239 Allergic contact dermatitis, unspecified cause: Secondary | ICD-10-CM | POA: Diagnosis not present

## 2022-06-02 DIAGNOSIS — L821 Other seborrheic keratosis: Secondary | ICD-10-CM | POA: Diagnosis not present

## 2022-06-02 DIAGNOSIS — D225 Melanocytic nevi of trunk: Secondary | ICD-10-CM | POA: Diagnosis not present

## 2022-06-15 DIAGNOSIS — F32A Depression, unspecified: Secondary | ICD-10-CM | POA: Diagnosis not present

## 2022-06-15 DIAGNOSIS — F419 Anxiety disorder, unspecified: Secondary | ICD-10-CM | POA: Diagnosis not present

## 2022-06-15 DIAGNOSIS — Z124 Encounter for screening for malignant neoplasm of cervix: Secondary | ICD-10-CM | POA: Diagnosis not present

## 2022-06-15 DIAGNOSIS — Z803 Family history of malignant neoplasm of breast: Secondary | ICD-10-CM | POA: Diagnosis not present

## 2022-06-15 DIAGNOSIS — N6489 Other specified disorders of breast: Secondary | ICD-10-CM | POA: Diagnosis not present

## 2022-06-15 DIAGNOSIS — Z1331 Encounter for screening for depression: Secondary | ICD-10-CM | POA: Diagnosis not present

## 2022-06-15 DIAGNOSIS — Z13220 Encounter for screening for lipoid disorders: Secondary | ICD-10-CM | POA: Diagnosis not present

## 2022-06-15 DIAGNOSIS — Z0000 Encounter for general adult medical examination without abnormal findings: Secondary | ICD-10-CM | POA: Diagnosis not present

## 2022-06-15 DIAGNOSIS — Z131 Encounter for screening for diabetes mellitus: Secondary | ICD-10-CM | POA: Diagnosis not present

## 2022-06-16 DIAGNOSIS — Z1231 Encounter for screening mammogram for malignant neoplasm of breast: Secondary | ICD-10-CM | POA: Diagnosis not present

## 2022-06-16 DIAGNOSIS — F321 Major depressive disorder, single episode, moderate: Secondary | ICD-10-CM | POA: Diagnosis not present

## 2022-06-30 DIAGNOSIS — F321 Major depressive disorder, single episode, moderate: Secondary | ICD-10-CM | POA: Diagnosis not present

## 2022-08-11 DIAGNOSIS — F321 Major depressive disorder, single episode, moderate: Secondary | ICD-10-CM | POA: Diagnosis not present

## 2022-10-14 DIAGNOSIS — F321 Major depressive disorder, single episode, moderate: Secondary | ICD-10-CM | POA: Diagnosis not present

## 2022-11-11 DIAGNOSIS — F321 Major depressive disorder, single episode, moderate: Secondary | ICD-10-CM | POA: Diagnosis not present

## 2022-11-24 DIAGNOSIS — F321 Major depressive disorder, single episode, moderate: Secondary | ICD-10-CM | POA: Diagnosis not present

## 2023-01-31 ENCOUNTER — Emergency Department
Admission: EM | Admit: 2023-01-31 | Discharge: 2023-01-31 | Disposition: A | Discharge status: Home / Self Care | Payer: BC Managed Care – PPO | Attending: Emergency Medicine | Admitting: Emergency Medicine

## 2023-01-31 ENCOUNTER — Other Ambulatory Visit: Payer: Self-pay

## 2023-01-31 DIAGNOSIS — R002 Palpitations: Secondary | ICD-10-CM | POA: Insufficient documentation

## 2023-01-31 LAB — BASIC METABOLIC PANEL
Anion gap: 6 (ref 5–15)
BUN: 14 mg/dL (ref 6–20)
CO2: 28 mmol/L (ref 22–32)
Calcium: 8.9 mg/dL (ref 8.9–10.3)
Chloride: 104 mmol/L (ref 98–111)
Creatinine, Ser: 0.83 mg/dL (ref 0.44–1.00)
GFR, Estimated: >60 mL/min (ref >60)
Glucose, Bld: 112 mg/dL — ABNORMAL HIGH (ref 70–99)
Potassium: 3.4 mmol/L — ABNORMAL LOW (ref 3.5–5.1)
Sodium: 138 mmol/L (ref 135–145)

## 2023-01-31 LAB — CBC WITH DIFFERENTIAL/PLATELET
Abs Immature Granulocytes: 0.01 10*3/uL (ref 0.00–0.07)
Basophils Absolute: 0 10*3/uL (ref 0.0–0.1)
Basophils Relative: 1 %
Eosinophils Absolute: 0.2 10*3/uL (ref 0.0–0.5)
Eosinophils Relative: 3 %
HCT: 39.5 % (ref 36.0–46.0)
Hemoglobin: 13.3 g/dL (ref 12.0–15.0)
Immature Granulocytes: 0 %
Lymphocytes Relative: 35 %
Lymphs Abs: 1.6 10*3/uL (ref 0.7–4.0)
MCH: 30.6 pg (ref 26.0–34.0)
MCHC: 33.7 g/dL (ref 30.0–36.0)
MCV: 90.8 fL (ref 80.0–100.0)
Monocytes Absolute: 0.4 10*3/uL (ref 0.1–1.0)
Monocytes Relative: 9 %
Neutro Abs: 2.4 10*3/uL (ref 1.7–7.7)
Neutrophils Relative %: 52 %
Platelets: 288 10*3/uL (ref 150–400)
RBC: 4.35 MIL/uL (ref 3.87–5.11)
RDW: 12 % (ref 11.5–15.5)
WBC: 4.6 10*3/uL (ref 4.0–10.5)
nRBC: 0 % (ref 0.0–0.2)

## 2023-01-31 LAB — TROPONIN I (HIGH SENSITIVITY): Troponin I (High Sensitivity): <2 ng/L (ref <18)

## 2023-01-31 LAB — TSH: TSH: 1.687 u[IU]/mL (ref 0.350–4.500)

## 2023-01-31 NOTE — Discharge Instructions (Signed)
Please call the number provided for cardiology to arrange a follow-up appointment.  Return to the emergency department for any chest pain or any other symptom personally concerning to yourself.  Please try to increase your sleep, decrease caffeine use and limit alcohol use.

## 2023-01-31 NOTE — ED Triage Notes (Signed)
Pt POV c/o heart racing started Sunday. Woke up in middle of the night "in panic" and felt like her heart is skipping a beat.   HR 68 in triage, no SOB, denies chest pain.

## 2023-01-31 NOTE — ED Provider Notes (Signed)
[facility] Provider Note    Event Date/Time   First MD Initiated Contact with Patient 01/31/23 1555     (approximate)  History   Chief Complaint: Palpitations  HPI  Carolyn Lopez is a 47 y.o. female who presents to the emergency department for palpitations.  According to the patient for the past several days she has been experiencing palpitations which she describes as a sensation of a skipped heartbeat.  Patient denies any chest pain but states she was feeling some shortness of breath when the symptoms occur.  Denies any recent fever cough congestion no illnesses.  No history of palpitations previously.  Denies any heart racing sensation.  Physical Exam   Triage Vital Signs: ED Triage Vitals  Enc Vitals Group     BP 01/31/23 1537 131/82     Pulse Rate 01/31/23 1537 68     Resp 01/31/23 1537 16     Temp 01/31/23 1537 98.7 F (37.1 C)     Temp Source 01/31/23 1537 Oral     SpO2 01/31/23 1537 98 %     Weight 01/31/23 1536 185 lb (83.9 kg)     Height 01/31/23 1536 '5\' 3"'$  (1.6 m)     Head Circumference --      Peak Flow --      Pain Score 01/31/23 1535 0     Pain Loc --      Pain Edu? --      Excl. in [place]? --     Most recent vital signs: Vitals:   01/31/23 1537  BP: 131/82  Pulse: 68  Resp: 16  Temp: 98.7 F (37.1 C)  SpO2: 98%    General: Awake, no distress.  CV:  Good peripheral perfusion.  Regular rate and rhythm  Resp:  Normal effort.  Equal breath sounds bilaterally.  Abd:  No distention.  Soft, nontender.  No rebound or guarding.   ED Results / Procedures / Treatments   EKG  EKG viewed and interpreted by myself shows normal sinus rhythm at 66 bpm with a narrow QRS, normal axis, normal intervals, no concerning ST changes.  MEDICATIONS ORDERED IN ED: Medications - No data to display   IMPRESSION / MDM / [place] / ED COURSE  I reviewed the triage vital signs and the nursing notes.  Patient's presentation is most  consistent with acute presentation with potential threat to life or bodily function.  Patient presents emergency department for intermittent palpitations over the last few days.  Overall the patient appears well, no distress.  Reassuring physical exam, normal heart sounds, no ectopic beats or arrhythmias auscultated.  EKG is reassuring.  We will check basic labs including CBC chemistry troponin and TSH.  We will monitor on the cardiac monitor.  Overall patient appears well highly suspect PVCs or PACs given the patient's description of her symptoms.  If the workup today is normal we will plan to have the patient follow-up with cardiology for a Holter monitor.  Patient agreeable to plan of care.  Patient's lab work is reassuring.  CBC is normal, chemistry shows no concerning findings.  Troponin negative.  TSH is normal.  Given the patient's reassuring workup I believe the patient is safe for discharge home.  We will refer to cardiology for further workup such as a Holter monitor.  Patient is agreeable.  FINAL CLINICAL IMPRESSION(S) / ED DIAGNOSES   Palpitations    Note:  This document was prepared using Dragon voice recognition software  and may include unintentional dictation errors.   Harvest Dark, MD 01/31/23 1719

## 2023-12-01 ENCOUNTER — Encounter: Payer: Self-pay | Admitting: Dietician

## 2023-12-01 ENCOUNTER — Encounter: Payer: BC Managed Care – PPO | Attending: Student | Admitting: Dietician

## 2023-12-01 DIAGNOSIS — E669 Obesity, unspecified: Secondary | ICD-10-CM

## 2023-12-01 NOTE — Patient Instructions (Addendum)
Consider taking a daily bone health supplement. This contains a combination of Calcium (look for Citrate), Vitamin D, and sometimes Phosphorus and Vitamin K.  Look into a class schedule at [employer] to see what options may interest you! Pick something to get your competitive streak fired up!  When having your meals, work to be more mindful!  Try to make your meals a sensory experience! Pay attention to how your food looks, smells, tastes, texture, etc. When eating, have one bite and put down your utensil until you have fully chewed and swallowed your bite. Repeat this process to work on being more aware and paced during your meals!

## 2023-12-01 NOTE — Progress Notes (Signed)
Medical Nutrition Therapy  Appointment Start time:  1100  Appointment End time:  1215  Primary concerns today: Improving Overall Health   Referral diagnosis: E66.811 - Obesity, Class 1 Preferred learning style: No preference indicated Learning readiness: Ready   NUTRITION ASSESSMENT   Clinical Medical Hx: GERD, Obesity Medications: Escitalopram, Hydroxyzine (PRN) Labs: Reviewed Notable Signs/Symptoms: N/A   Lifestyle & Dietary Hx Pt daughter, Renea Ee, present during appointment. Pt reports desire to improve diet to allow healthful aging, ensure bone density. Pt reports gaining ~10 lbs. after starting escitalopram this summer, wants to prevent further weight gain. Pt reports having regular heartburn, states red meat, spicy food, alcohol, salty foods trigger it. Pt reports trying different types of exercise in the past (cross-fit, yoga), wants to incorporate exercise as they get older, pt states they prefer group exercise environment.   Estimated daily fluid intake: 64 oz Supplements: N/A Sleep: No issues Stress / self-care: Medium to High, Listens to music, Woodwork/Gardening for hobbies Current average weekly physical activity: ADLs   24-Hr Dietary Recall First Meal: Kodi[place] protein oats Snack:  Second Meal: None (usually salad) Snack:  Third Meal: Chicken,  Snack:  Beverages: water   NUTRITION DIAGNOSIS  NB-1.1 Food and nutrition-related knowledge deficit As related to Obesity.  As evidenced by BMI of 32.77 kg/m2, dietary recall higher in fatty foods .   NUTRITION INTERVENTION  Nutrition education (E-1) on the following topics:  Educated patient on the two components of energy balance: Energy in (calories), and energy out (activity). Explain the role of negative energy balance in weight loss. Discussed options with patient to achieve a negative energy balance and how to best control energy in and energy out to accommodate their lifestyle. Educated pt on potential food  sources (high fat foods,spicy foods, high refined sugar foods, caffeine, etc.) that can trigger reflux and/or other GERD symptoms including chest pain. Educated pt to avoid eating within 2-3 hours of going to bed to minimize reflux. Advised pt to sleep with head elevated to decrease reflux. Educated pt on the potential health complications that can occur related to uncontrolled GERD, including stomach ulcers and esophageal erosion/cancer. Educated pt on the potential for B12 deficiency related to acid reducing medication.   Handouts Provided Include  GERD Nutrition  Protein List  Learning Style & Readiness for Change Teaching method utilized: Visual & Auditory  Demonstrated degree of understanding via: Teach Back  Barriers to learning/adherence to lifestyle change: None  Goals Established by Pt Consider taking a daily bone health supplement. This contains a combination of Calcium (look for Citrate), Vitamin D, and sometimes Phosphorus and Vitamin K. Look into a class schedule at [employer] to see what options may interest you! Pick something to get your competitive streak fired up! When having your meals, work to be more mindful!  Try to make your meals a sensory experience! Pay attention to how your food looks, smells, tastes, texture, etc. When eating, have one bite and put down your utensil until you have fully chewed and swallowed your bite. Repeat this process to work on being more aware and paced during your meals!   MONITORING & EVALUATION Dietary intake, weekly physical activity, and reflux symptoms PRN.  Next Steps  Patient is to call to schedule follow up.

## 2024-10-01 ENCOUNTER — Other Ambulatory Visit: Payer: Self-pay | Admitting: Student

## 2024-10-01 DIAGNOSIS — Z1231 Encounter for screening mammogram for malignant neoplasm of breast: Secondary | ICD-10-CM

## 2024-10-09 ENCOUNTER — Other Ambulatory Visit: Payer: Self-pay | Admitting: Medical Genetics

## 2024-10-10 ENCOUNTER — Other Ambulatory Visit
Admission: RE | Admit: 2024-10-10 | Discharge: 2024-10-10 | Disposition: A | Discharge status: Home / Self Care | Payer: Self-pay | Source: Ambulatory Visit | Attending: Medical Genetics | Admitting: Medical Genetics

## 2024-10-22 LAB — GENECONNECT MOLECULAR SCREEN: Genetic Analysis Overall Interpretation: NEGATIVE

## 2024-11-14 ENCOUNTER — Ambulatory Visit
Admission: RE | Admit: 2024-11-14 | Discharge: 2024-11-14 | Disposition: A | Discharge status: Home / Self Care | Source: Ambulatory Visit | Attending: Student | Admitting: Student

## 2024-11-14 DIAGNOSIS — Z1231 Encounter for screening mammogram for malignant neoplasm of breast: Secondary | ICD-10-CM | POA: Diagnosis present

## 2024-11-18 ENCOUNTER — Inpatient Hospital Stay
Admission: RE | Admit: 2024-11-18 | Discharge: 2024-11-18 | Disposition: A | Payer: Self-pay | Source: Ambulatory Visit | Attending: Student

## 2024-11-18 ENCOUNTER — Other Ambulatory Visit: Payer: Self-pay | Admitting: *Deleted

## 2024-11-18 ENCOUNTER — Ambulatory Visit: Admission: RE | Admit: 2024-11-18 | Source: Home / Self Care | Admitting: Gastroenterology

## 2024-11-18 DIAGNOSIS — Z1231 Encounter for screening mammogram for malignant neoplasm of breast: Secondary | ICD-10-CM

## 2024-11-18 SURGERY — COLONOSCOPY
Anesthesia: General
